# Patient Record
Sex: Male | Born: 1945
Health system: Southern US, Community
[De-identification: ages and names within clinical notes are randomized; demographics above are authoritative.]

---

## 2002-07-12 ENCOUNTER — Emergency Department (HOSPITAL_COMMUNITY): Admission: EM | Admit: 2002-07-12 | Discharge: 2002-07-12 | Payer: Self-pay | Admitting: Emergency Medicine

## 2002-07-12 ENCOUNTER — Encounter: Payer: Self-pay | Admitting: Emergency Medicine

## 2003-05-19 ENCOUNTER — Ambulatory Visit (HOSPITAL_COMMUNITY): Admission: RE | Admit: 2003-05-19 | Discharge: 2003-05-19 | Payer: Self-pay | Admitting: Gastroenterology

## 2013-01-19 ENCOUNTER — Ambulatory Visit
Admission: RE | Admit: 2013-01-19 | Discharge: 2013-01-19 | Disposition: A | Discharge status: Home / Self Care | Payer: Medicare Other | Source: Ambulatory Visit | Attending: Internal Medicine | Admitting: Internal Medicine

## 2013-01-19 ENCOUNTER — Other Ambulatory Visit: Payer: Self-pay | Admitting: Internal Medicine

## 2013-01-19 DIAGNOSIS — R059 Cough, unspecified: Secondary | ICD-10-CM

## 2013-01-19 DIAGNOSIS — R05 Cough: Secondary | ICD-10-CM

## 2015-05-24 DIAGNOSIS — K219 Gastro-esophageal reflux disease without esophagitis: Secondary | ICD-10-CM | POA: Diagnosis not present

## 2015-05-24 DIAGNOSIS — E782 Mixed hyperlipidemia: Secondary | ICD-10-CM | POA: Diagnosis not present

## 2015-05-24 DIAGNOSIS — N400 Enlarged prostate without lower urinary tract symptoms: Secondary | ICD-10-CM | POA: Diagnosis not present

## 2015-05-24 DIAGNOSIS — I10 Essential (primary) hypertension: Secondary | ICD-10-CM | POA: Diagnosis not present

## 2015-05-24 DIAGNOSIS — H3530 Unspecified macular degeneration: Secondary | ICD-10-CM | POA: Diagnosis not present

## 2015-05-24 DIAGNOSIS — Z1389 Encounter for screening for other disorder: Secondary | ICD-10-CM | POA: Diagnosis not present

## 2015-05-24 DIAGNOSIS — Z0000 Encounter for general adult medical examination without abnormal findings: Secondary | ICD-10-CM | POA: Diagnosis not present

## 2015-08-07 DIAGNOSIS — L2084 Intrinsic (allergic) eczema: Secondary | ICD-10-CM | POA: Diagnosis not present

## 2015-08-07 DIAGNOSIS — L814 Other melanin hyperpigmentation: Secondary | ICD-10-CM | POA: Diagnosis not present

## 2015-08-07 DIAGNOSIS — L723 Sebaceous cyst: Secondary | ICD-10-CM | POA: Diagnosis not present

## 2015-08-07 DIAGNOSIS — D1801 Hemangioma of skin and subcutaneous tissue: Secondary | ICD-10-CM | POA: Diagnosis not present

## 2015-08-07 DIAGNOSIS — D225 Melanocytic nevi of trunk: Secondary | ICD-10-CM | POA: Diagnosis not present

## 2015-08-07 DIAGNOSIS — L821 Other seborrheic keratosis: Secondary | ICD-10-CM | POA: Diagnosis not present

## 2015-08-16 DIAGNOSIS — H353111 Nonexudative age-related macular degeneration, right eye, early dry stage: Secondary | ICD-10-CM | POA: Diagnosis not present

## 2015-08-16 DIAGNOSIS — H353222 Exudative age-related macular degeneration, left eye, with inactive choroidal neovascularization: Secondary | ICD-10-CM | POA: Diagnosis not present

## 2015-11-07 DIAGNOSIS — N401 Enlarged prostate with lower urinary tract symptoms: Secondary | ICD-10-CM | POA: Diagnosis not present

## 2015-11-07 DIAGNOSIS — N5201 Erectile dysfunction due to arterial insufficiency: Secondary | ICD-10-CM | POA: Diagnosis not present

## 2015-11-07 DIAGNOSIS — N403 Nodular prostate with lower urinary tract symptoms: Secondary | ICD-10-CM | POA: Diagnosis not present

## 2015-11-07 DIAGNOSIS — R3915 Urgency of urination: Secondary | ICD-10-CM | POA: Diagnosis not present

## 2016-01-21 DIAGNOSIS — H524 Presbyopia: Secondary | ICD-10-CM | POA: Diagnosis not present

## 2016-01-21 DIAGNOSIS — H5203 Hypermetropia, bilateral: Secondary | ICD-10-CM | POA: Diagnosis not present

## 2016-01-21 DIAGNOSIS — H2513 Age-related nuclear cataract, bilateral: Secondary | ICD-10-CM | POA: Diagnosis not present

## 2016-01-21 DIAGNOSIS — H353222 Exudative age-related macular degeneration, left eye, with inactive choroidal neovascularization: Secondary | ICD-10-CM | POA: Diagnosis not present

## 2016-01-21 DIAGNOSIS — H52223 Regular astigmatism, bilateral: Secondary | ICD-10-CM | POA: Diagnosis not present

## 2016-02-26 DIAGNOSIS — H353112 Nonexudative age-related macular degeneration, right eye, intermediate dry stage: Secondary | ICD-10-CM | POA: Diagnosis not present

## 2016-02-26 DIAGNOSIS — H353222 Exudative age-related macular degeneration, left eye, with inactive choroidal neovascularization: Secondary | ICD-10-CM | POA: Diagnosis not present

## 2016-02-26 DIAGNOSIS — D3131 Benign neoplasm of right choroid: Secondary | ICD-10-CM | POA: Diagnosis not present

## 2016-02-26 DIAGNOSIS — H43822 Vitreomacular adhesion, left eye: Secondary | ICD-10-CM | POA: Diagnosis not present

## 2016-06-13 DIAGNOSIS — K219 Gastro-esophageal reflux disease without esophagitis: Secondary | ICD-10-CM | POA: Diagnosis not present

## 2016-06-13 DIAGNOSIS — N400 Enlarged prostate without lower urinary tract symptoms: Secondary | ICD-10-CM | POA: Diagnosis not present

## 2016-06-13 DIAGNOSIS — Z1389 Encounter for screening for other disorder: Secondary | ICD-10-CM | POA: Diagnosis not present

## 2016-06-13 DIAGNOSIS — Z0000 Encounter for general adult medical examination without abnormal findings: Secondary | ICD-10-CM | POA: Diagnosis not present

## 2016-09-02 DIAGNOSIS — H353222 Exudative age-related macular degeneration, left eye, with inactive choroidal neovascularization: Secondary | ICD-10-CM | POA: Diagnosis not present

## 2016-09-26 DIAGNOSIS — K219 Gastro-esophageal reflux disease without esophagitis: Secondary | ICD-10-CM | POA: Diagnosis not present

## 2016-10-03 DIAGNOSIS — H353112 Nonexudative age-related macular degeneration, right eye, intermediate dry stage: Secondary | ICD-10-CM | POA: Diagnosis not present

## 2016-10-03 DIAGNOSIS — D3131 Benign neoplasm of right choroid: Secondary | ICD-10-CM | POA: Diagnosis not present

## 2016-10-03 DIAGNOSIS — H43811 Vitreous degeneration, right eye: Secondary | ICD-10-CM | POA: Diagnosis not present

## 2016-10-03 DIAGNOSIS — H353222 Exudative age-related macular degeneration, left eye, with inactive choroidal neovascularization: Secondary | ICD-10-CM | POA: Diagnosis not present

## 2016-11-05 DIAGNOSIS — N403 Nodular prostate with lower urinary tract symptoms: Secondary | ICD-10-CM | POA: Diagnosis not present

## 2016-11-05 DIAGNOSIS — Z87442 Personal history of urinary calculi: Secondary | ICD-10-CM | POA: Diagnosis not present

## 2016-11-05 DIAGNOSIS — R3915 Urgency of urination: Secondary | ICD-10-CM | POA: Diagnosis not present

## 2016-11-05 DIAGNOSIS — N401 Enlarged prostate with lower urinary tract symptoms: Secondary | ICD-10-CM | POA: Diagnosis not present

## 2016-12-02 DIAGNOSIS — H353112 Nonexudative age-related macular degeneration, right eye, intermediate dry stage: Secondary | ICD-10-CM | POA: Diagnosis not present

## 2016-12-02 DIAGNOSIS — H353222 Exudative age-related macular degeneration, left eye, with inactive choroidal neovascularization: Secondary | ICD-10-CM | POA: Diagnosis not present

## 2016-12-17 DIAGNOSIS — L821 Other seborrheic keratosis: Secondary | ICD-10-CM | POA: Diagnosis not present

## 2016-12-17 DIAGNOSIS — D485 Neoplasm of uncertain behavior of skin: Secondary | ICD-10-CM | POA: Diagnosis not present

## 2016-12-17 DIAGNOSIS — L719 Rosacea, unspecified: Secondary | ICD-10-CM | POA: Diagnosis not present

## 2016-12-17 DIAGNOSIS — C44612 Basal cell carcinoma of skin of right upper limb, including shoulder: Secondary | ICD-10-CM | POA: Diagnosis not present

## 2016-12-17 DIAGNOSIS — D225 Melanocytic nevi of trunk: Secondary | ICD-10-CM | POA: Diagnosis not present

## 2017-01-08 DIAGNOSIS — Z23 Encounter for immunization: Secondary | ICD-10-CM | POA: Diagnosis not present

## 2017-01-08 DIAGNOSIS — C44612 Basal cell carcinoma of skin of right upper limb, including shoulder: Secondary | ICD-10-CM | POA: Diagnosis not present

## 2017-01-23 DIAGNOSIS — H353112 Nonexudative age-related macular degeneration, right eye, intermediate dry stage: Secondary | ICD-10-CM | POA: Diagnosis not present

## 2017-01-23 DIAGNOSIS — H43811 Vitreous degeneration, right eye: Secondary | ICD-10-CM | POA: Diagnosis not present

## 2017-01-23 DIAGNOSIS — H43822 Vitreomacular adhesion, left eye: Secondary | ICD-10-CM | POA: Diagnosis not present

## 2017-01-23 DIAGNOSIS — H353222 Exudative age-related macular degeneration, left eye, with inactive choroidal neovascularization: Secondary | ICD-10-CM | POA: Diagnosis not present

## 2017-03-16 DIAGNOSIS — H2513 Age-related nuclear cataract, bilateral: Secondary | ICD-10-CM | POA: Diagnosis not present

## 2017-06-22 DIAGNOSIS — Z0000 Encounter for general adult medical examination without abnormal findings: Secondary | ICD-10-CM | POA: Diagnosis not present

## 2017-06-22 DIAGNOSIS — Z1389 Encounter for screening for other disorder: Secondary | ICD-10-CM | POA: Diagnosis not present

## 2017-06-22 DIAGNOSIS — E7800 Pure hypercholesterolemia, unspecified: Secondary | ICD-10-CM | POA: Diagnosis not present

## 2017-06-22 DIAGNOSIS — K219 Gastro-esophageal reflux disease without esophagitis: Secondary | ICD-10-CM | POA: Diagnosis not present

## 2017-07-17 DIAGNOSIS — H43822 Vitreomacular adhesion, left eye: Secondary | ICD-10-CM | POA: Diagnosis not present

## 2017-07-17 DIAGNOSIS — H353222 Exudative age-related macular degeneration, left eye, with inactive choroidal neovascularization: Secondary | ICD-10-CM | POA: Diagnosis not present

## 2017-07-17 DIAGNOSIS — H353112 Nonexudative age-related macular degeneration, right eye, intermediate dry stage: Secondary | ICD-10-CM | POA: Diagnosis not present

## 2017-07-17 DIAGNOSIS — D3131 Benign neoplasm of right choroid: Secondary | ICD-10-CM | POA: Diagnosis not present

## 2017-08-25 DIAGNOSIS — M9903 Segmental and somatic dysfunction of lumbar region: Secondary | ICD-10-CM | POA: Diagnosis not present

## 2017-08-25 DIAGNOSIS — M545 Low back pain: Secondary | ICD-10-CM | POA: Diagnosis not present

## 2017-08-25 DIAGNOSIS — M9905 Segmental and somatic dysfunction of pelvic region: Secondary | ICD-10-CM | POA: Diagnosis not present

## 2017-08-25 DIAGNOSIS — M9904 Segmental and somatic dysfunction of sacral region: Secondary | ICD-10-CM | POA: Diagnosis not present

## 2017-08-26 DIAGNOSIS — M9903 Segmental and somatic dysfunction of lumbar region: Secondary | ICD-10-CM | POA: Diagnosis not present

## 2017-08-26 DIAGNOSIS — M9904 Segmental and somatic dysfunction of sacral region: Secondary | ICD-10-CM | POA: Diagnosis not present

## 2017-08-26 DIAGNOSIS — M545 Low back pain: Secondary | ICD-10-CM | POA: Diagnosis not present

## 2017-08-26 DIAGNOSIS — M9905 Segmental and somatic dysfunction of pelvic region: Secondary | ICD-10-CM | POA: Diagnosis not present

## 2017-08-28 DIAGNOSIS — M9905 Segmental and somatic dysfunction of pelvic region: Secondary | ICD-10-CM | POA: Diagnosis not present

## 2017-08-28 DIAGNOSIS — M545 Low back pain: Secondary | ICD-10-CM | POA: Diagnosis not present

## 2017-08-28 DIAGNOSIS — M9903 Segmental and somatic dysfunction of lumbar region: Secondary | ICD-10-CM | POA: Diagnosis not present

## 2017-08-28 DIAGNOSIS — M9904 Segmental and somatic dysfunction of sacral region: Secondary | ICD-10-CM | POA: Diagnosis not present

## 2017-09-11 DIAGNOSIS — N200 Calculus of kidney: Secondary | ICD-10-CM | POA: Diagnosis not present

## 2017-11-06 DIAGNOSIS — S61411D Laceration without foreign body of right hand, subsequent encounter: Secondary | ICD-10-CM | POA: Diagnosis not present

## 2017-11-10 DIAGNOSIS — D485 Neoplasm of uncertain behavior of skin: Secondary | ICD-10-CM | POA: Diagnosis not present

## 2017-11-10 DIAGNOSIS — L821 Other seborrheic keratosis: Secondary | ICD-10-CM | POA: Diagnosis not present

## 2017-11-10 DIAGNOSIS — D1801 Hemangioma of skin and subcutaneous tissue: Secondary | ICD-10-CM | POA: Diagnosis not present

## 2017-11-10 DIAGNOSIS — L570 Actinic keratosis: Secondary | ICD-10-CM | POA: Diagnosis not present

## 2017-11-10 DIAGNOSIS — L814 Other melanin hyperpigmentation: Secondary | ICD-10-CM | POA: Diagnosis not present

## 2017-12-16 DIAGNOSIS — N200 Calculus of kidney: Secondary | ICD-10-CM | POA: Diagnosis not present

## 2017-12-16 DIAGNOSIS — N5201 Erectile dysfunction due to arterial insufficiency: Secondary | ICD-10-CM | POA: Diagnosis not present

## 2017-12-16 DIAGNOSIS — N401 Enlarged prostate with lower urinary tract symptoms: Secondary | ICD-10-CM | POA: Diagnosis not present

## 2017-12-16 DIAGNOSIS — R3915 Urgency of urination: Secondary | ICD-10-CM | POA: Diagnosis not present

## 2018-02-12 DIAGNOSIS — H353112 Nonexudative age-related macular degeneration, right eye, intermediate dry stage: Secondary | ICD-10-CM | POA: Diagnosis not present

## 2018-02-12 DIAGNOSIS — H43811 Vitreous degeneration, right eye: Secondary | ICD-10-CM | POA: Diagnosis not present

## 2018-02-12 DIAGNOSIS — H353222 Exudative age-related macular degeneration, left eye, with inactive choroidal neovascularization: Secondary | ICD-10-CM | POA: Diagnosis not present

## 2018-04-01 DIAGNOSIS — J0120 Acute ethmoidal sinusitis, unspecified: Secondary | ICD-10-CM | POA: Diagnosis not present

## 2018-04-05 ENCOUNTER — Emergency Department (HOSPITAL_COMMUNITY): Payer: Medicare Other

## 2018-04-05 ENCOUNTER — Encounter (HOSPITAL_COMMUNITY): Payer: Self-pay | Admitting: *Deleted

## 2018-04-05 ENCOUNTER — Inpatient Hospital Stay (HOSPITAL_COMMUNITY)
Admission: EM | Admit: 2018-04-05 | Discharge: 2018-04-06 | DRG: 282 | Disposition: A | Discharge status: Home / Self Care | Payer: Medicare Other | Attending: Internal Medicine | Admitting: Internal Medicine

## 2018-04-05 ENCOUNTER — Other Ambulatory Visit: Payer: Self-pay

## 2018-04-05 DIAGNOSIS — R0600 Dyspnea, unspecified: Secondary | ICD-10-CM | POA: Diagnosis not present

## 2018-04-05 DIAGNOSIS — R1013 Epigastric pain: Secondary | ICD-10-CM | POA: Diagnosis not present

## 2018-04-05 DIAGNOSIS — Z79899 Other long term (current) drug therapy: Secondary | ICD-10-CM

## 2018-04-05 DIAGNOSIS — R7989 Other specified abnormal findings of blood chemistry: Secondary | ICD-10-CM | POA: Diagnosis not present

## 2018-04-05 DIAGNOSIS — N400 Enlarged prostate without lower urinary tract symptoms: Secondary | ICD-10-CM | POA: Diagnosis not present

## 2018-04-05 DIAGNOSIS — E785 Hyperlipidemia, unspecified: Secondary | ICD-10-CM | POA: Diagnosis not present

## 2018-04-05 DIAGNOSIS — I2510 Atherosclerotic heart disease of native coronary artery without angina pectoris: Secondary | ICD-10-CM

## 2018-04-05 DIAGNOSIS — I4510 Unspecified right bundle-branch block: Secondary | ICD-10-CM | POA: Diagnosis not present

## 2018-04-05 DIAGNOSIS — R079 Chest pain, unspecified: Secondary | ICD-10-CM | POA: Diagnosis not present

## 2018-04-05 DIAGNOSIS — G4700 Insomnia, unspecified: Secondary | ICD-10-CM | POA: Diagnosis not present

## 2018-04-05 DIAGNOSIS — M79602 Pain in left arm: Secondary | ICD-10-CM | POA: Diagnosis not present

## 2018-04-05 DIAGNOSIS — I214 Non-ST elevation (NSTEMI) myocardial infarction: Principal | ICD-10-CM | POA: Diagnosis present

## 2018-04-05 DIAGNOSIS — R51 Headache: Secondary | ICD-10-CM | POA: Diagnosis not present

## 2018-04-05 DIAGNOSIS — I509 Heart failure, unspecified: Secondary | ICD-10-CM | POA: Diagnosis not present

## 2018-04-05 DIAGNOSIS — I340 Nonrheumatic mitral (valve) insufficiency: Secondary | ICD-10-CM | POA: Diagnosis not present

## 2018-04-05 DIAGNOSIS — R778 Other specified abnormalities of plasma proteins: Secondary | ICD-10-CM

## 2018-04-05 DIAGNOSIS — R0602 Shortness of breath: Secondary | ICD-10-CM | POA: Diagnosis not present

## 2018-04-05 LAB — CBC
HCT: 40.3 % (ref 39.0–52.0)
Hemoglobin: 13.1 g/dL (ref 13.0–17.0)
MCH: 30.5 pg (ref 26.0–34.0)
MCHC: 32.5 g/dL (ref 30.0–36.0)
MCV: 93.7 fL (ref 80.0–100.0)
Platelets: 366 10*3/uL (ref 150–400)
RBC: 4.3 MIL/uL (ref 4.22–5.81)
RDW: 13.2 % (ref 11.5–15.5)
WBC: 9.7 10*3/uL (ref 4.0–10.5)
nRBC: 0 % (ref 0.0–0.2)

## 2018-04-05 LAB — BASIC METABOLIC PANEL
Anion gap: 10 (ref 5–15)
BUN: 11 mg/dL (ref 8–23)
CO2: 25 mmol/L (ref 22–32)
CREATININE: 0.93 mg/dL (ref 0.61–1.24)
Calcium: 9.1 mg/dL (ref 8.9–10.3)
Chloride: 105 mmol/L (ref 98–111)
GFR calc Af Amer: >60 mL/min (ref >60)
GFR calc non Af Amer: >60 mL/min (ref >60)
Glucose, Bld: 128 mg/dL — ABNORMAL HIGH (ref 70–99)
Potassium: 3.5 mmol/L (ref 3.5–5.1)
Sodium: 140 mmol/L (ref 135–145)

## 2018-04-05 LAB — TROPONIN I: Troponin I: 0.21 ng/mL (ref <0.03)

## 2018-04-05 NOTE — ED Triage Notes (Signed)
The pt has been ill for almost 2 weeks headache chest tifghtness today muscle aches  He saw his doctor earlier today and had labwork drawn  He was called approc 2 hoours ago and was told he had abmnormal labs to come to the ed

## 2018-04-06 ENCOUNTER — Emergency Department (HOSPITAL_COMMUNITY): Payer: Medicare Other

## 2018-04-06 ENCOUNTER — Inpatient Hospital Stay (HOSPITAL_COMMUNITY): Payer: Medicare Other

## 2018-04-06 ENCOUNTER — Encounter (HOSPITAL_COMMUNITY)
Admission: EM | Disposition: A | Discharge status: Home / Self Care | Payer: Self-pay | Source: Home / Self Care | Attending: Internal Medicine

## 2018-04-06 ENCOUNTER — Other Ambulatory Visit: Payer: Self-pay

## 2018-04-06 ENCOUNTER — Encounter (HOSPITAL_COMMUNITY): Payer: Self-pay | Admitting: *Deleted

## 2018-04-06 DIAGNOSIS — I340 Nonrheumatic mitral (valve) insufficiency: Secondary | ICD-10-CM | POA: Diagnosis not present

## 2018-04-06 DIAGNOSIS — N400 Enlarged prostate without lower urinary tract symptoms: Secondary | ICD-10-CM | POA: Diagnosis present

## 2018-04-06 DIAGNOSIS — I2510 Atherosclerotic heart disease of native coronary artery without angina pectoris: Secondary | ICD-10-CM | POA: Diagnosis present

## 2018-04-06 DIAGNOSIS — R7989 Other specified abnormal findings of blood chemistry: Secondary | ICD-10-CM | POA: Diagnosis not present

## 2018-04-06 DIAGNOSIS — R51 Headache: Secondary | ICD-10-CM | POA: Diagnosis present

## 2018-04-06 DIAGNOSIS — E785 Hyperlipidemia, unspecified: Secondary | ICD-10-CM | POA: Diagnosis present

## 2018-04-06 DIAGNOSIS — I4510 Unspecified right bundle-branch block: Secondary | ICD-10-CM | POA: Diagnosis present

## 2018-04-06 DIAGNOSIS — I214 Non-ST elevation (NSTEMI) myocardial infarction: Secondary | ICD-10-CM

## 2018-04-06 DIAGNOSIS — Z79899 Other long term (current) drug therapy: Secondary | ICD-10-CM | POA: Diagnosis not present

## 2018-04-06 DIAGNOSIS — R778 Other specified abnormalities of plasma proteins: Secondary | ICD-10-CM

## 2018-04-06 DIAGNOSIS — I25119 Atherosclerotic heart disease of native coronary artery with unspecified angina pectoris: Secondary | ICD-10-CM

## 2018-04-06 HISTORY — PX: LEFT HEART CATH AND CORONARY ANGIOGRAPHY: CATH118249

## 2018-04-06 LAB — BASIC METABOLIC PANEL
Anion gap: 12 (ref 5–15)
BUN: 10 mg/dL (ref 8–23)
CO2: 24 mmol/L (ref 22–32)
Calcium: 8.6 mg/dL — ABNORMAL LOW (ref 8.9–10.3)
Chloride: 105 mmol/L (ref 98–111)
Creatinine, Ser: 0.92 mg/dL (ref 0.61–1.24)
GFR calc Af Amer: >60 mL/min (ref >60)
Glucose, Bld: 116 mg/dL — ABNORMAL HIGH (ref 70–99)
Potassium: 3.7 mmol/L (ref 3.5–5.1)
Sodium: 141 mmol/L (ref 135–145)

## 2018-04-06 LAB — CBC
HCT: 37.7 % — ABNORMAL LOW (ref 39.0–52.0)
Hemoglobin: 12.8 g/dL — ABNORMAL LOW (ref 13.0–17.0)
MCH: 31.4 pg (ref 26.0–34.0)
MCHC: 34 g/dL (ref 30.0–36.0)
MCV: 92.6 fL (ref 80.0–100.0)
Platelets: 377 10*3/uL (ref 150–400)
RBC: 4.07 MIL/uL — AB (ref 4.22–5.81)
RDW: 13.2 % (ref 11.5–15.5)
WBC: 9.8 10*3/uL (ref 4.0–10.5)
nRBC: 0 % (ref 0.0–0.2)

## 2018-04-06 LAB — LIPID PANEL
Cholesterol: 119 mg/dL (ref 0–200)
HDL: 23 mg/dL — ABNORMAL LOW (ref >40)
LDL Cholesterol: 85 mg/dL (ref 0–99)
Total CHOL/HDL Ratio: 5.2 RATIO
Triglycerides: 53 mg/dL (ref <150)
VLDL: 11 mg/dL (ref 0–40)

## 2018-04-06 LAB — CK: Total CK: 82 U/L (ref 49–397)

## 2018-04-06 LAB — TSH: TSH: 3.248 u[IU]/mL (ref 0.350–4.500)

## 2018-04-06 LAB — ECHOCARDIOGRAM COMPLETE
HEIGHTINCHES: 72 in
Weight: 3120 oz

## 2018-04-06 LAB — T4, FREE: Free T4: 1.1 ng/dL (ref 0.82–1.77)

## 2018-04-06 LAB — BRAIN NATRIURETIC PEPTIDE: B Natriuretic Peptide: 624.3 pg/mL — ABNORMAL HIGH (ref 0.0–100.0)

## 2018-04-06 LAB — PROTIME-INR
INR: 1.19
Prothrombin Time: 14.9 seconds (ref 11.4–15.2)

## 2018-04-06 LAB — D-DIMER, QUANTITATIVE: D-Dimer, Quant: 1.44 ug/mL-FEU — ABNORMAL HIGH (ref 0.00–0.50)

## 2018-04-06 LAB — TROPONIN I
Troponin I: 0.28 ng/mL (ref <0.03)
Troponin I: 0.23 ng/mL (ref <0.03)

## 2018-04-06 LAB — MRSA PCR SCREENING: MRSA by PCR: NEGATIVE

## 2018-04-06 LAB — HIV ANTIBODY (ROUTINE TESTING W REFLEX): HIV Screen 4th Generation wRfx: NONREACTIVE

## 2018-04-06 SURGERY — LEFT HEART CATH AND CORONARY ANGIOGRAPHY
Anesthesia: LOCAL

## 2018-04-06 MED ORDER — ASPIRIN EC 81 MG PO TBEC: 81.0000 mg | DELAYED_RELEASE_TABLET | Freq: Every day | ORAL | Status: DC

## 2018-04-06 MED ORDER — LIDOCAINE HCL (PF) 1 % IJ SOLN
INTRAMUSCULAR | Status: AC
  Filled 2018-04-06: qty 30

## 2018-04-06 MED ORDER — ASPIRIN 81 MG PO TBEC: 81.0000 mg | DELAYED_RELEASE_TABLET | Freq: Every day | ORAL | 0 refills | Status: AC

## 2018-04-06 MED ORDER — HEPARIN (PORCINE) 25000 UT/250ML-% IV SOLN
1100.0000 [IU]/h | INTRAVENOUS | Status: DC
  Administered 2018-04-06: 1100 [IU]/h via INTRAVENOUS
  Filled 2018-04-06: qty 250

## 2018-04-06 MED ORDER — ACETAMINOPHEN 325 MG PO TABS
650.0000 mg | ORAL_TABLET | ORAL | Status: DC | PRN
  Administered 2018-04-06: 650 mg via ORAL
  Filled 2018-04-06: qty 2

## 2018-04-06 MED ORDER — PANTOPRAZOLE SODIUM 20 MG PO TBEC
20.0000 mg | DELAYED_RELEASE_TABLET | Freq: Two times a day (BID) | ORAL | Status: DC
  Administered 2018-04-06: 20 mg via ORAL
  Filled 2018-04-06 (×2): qty 1

## 2018-04-06 MED ORDER — SODIUM CHLORIDE 0.9% FLUSH: 3.0000 mL | INTRAVENOUS | Status: DC | PRN

## 2018-04-06 MED ORDER — ATORVASTATIN CALCIUM 80 MG PO TABS
80.0000 mg | ORAL_TABLET | Freq: Every day | ORAL | Status: DC
  Filled 2018-04-06: qty 1

## 2018-04-06 MED ORDER — SODIUM CHLORIDE 0.9 % IV SOLN: 250.0000 mL | INTRAVENOUS | Status: DC | PRN

## 2018-04-06 MED ORDER — ASPIRIN 81 MG PO CHEW: 81.0000 mg | CHEWABLE_TABLET | ORAL | Status: DC

## 2018-04-06 MED ORDER — SODIUM CHLORIDE 0.9 % WEIGHT BASED INFUSION: 1.0000 mL/kg/h | INTRAVENOUS | Status: DC

## 2018-04-06 MED ORDER — HEPARIN BOLUS VIA INFUSION
4000.0000 [IU] | Freq: Once | INTRAVENOUS | Status: AC
  Administered 2018-04-06: 4000 [IU] via INTRAVENOUS
  Filled 2018-04-06: qty 4000

## 2018-04-06 MED ORDER — HEPARIN SODIUM (PORCINE) 1000 UNIT/ML IJ SOLN
INTRAMUSCULAR | Status: DC | PRN
  Administered 2018-04-06: 4500 [IU] via INTRAVENOUS

## 2018-04-06 MED ORDER — SODIUM CHLORIDE 0.9 % WEIGHT BASED INFUSION
3.0000 mL/kg/h | INTRAVENOUS | Status: DC
  Administered 2018-04-06: 3 mL/kg/h via INTRAVENOUS

## 2018-04-06 MED ORDER — ATORVASTATIN CALCIUM 20 MG PO TABS: 10.0000 mg | ORAL_TABLET | Freq: Every day | ORAL | 3 refills | Status: AC

## 2018-04-06 MED ORDER — IOPAMIDOL (ISOVUE-370) INJECTION 76%
75.0000 mL | Freq: Once | INTRAVENOUS | Status: AC | PRN
  Administered 2018-04-06: 75 mL via INTRAVENOUS

## 2018-04-06 MED ORDER — IBUPROFEN 200 MG PO TABS
800.0000 mg | ORAL_TABLET | Freq: Once | ORAL | Status: AC
  Administered 2018-04-06: 800 mg via ORAL
  Filled 2018-04-06: qty 4

## 2018-04-06 MED ORDER — NITROGLYCERIN 2 % TD OINT
1.0000 [in_us] | TOPICAL_OINTMENT | Freq: Once | TRANSDERMAL | Status: AC
  Administered 2018-04-06: 1 [in_us] via TOPICAL
  Filled 2018-04-06: qty 1

## 2018-04-06 MED ORDER — HEPARIN (PORCINE) IN NACL 1000-0.9 UT/500ML-% IV SOLN
INTRAVENOUS | Status: AC
  Filled 2018-04-06: qty 1000

## 2018-04-06 MED ORDER — SODIUM CHLORIDE 0.9% FLUSH: 3.0000 mL | Freq: Two times a day (BID) | INTRAVENOUS | Status: DC

## 2018-04-06 MED ORDER — SODIUM CHLORIDE 0.9 % IV SOLN: INTRAVENOUS | Status: DC

## 2018-04-06 MED ORDER — IOHEXOL 350 MG/ML SOLN
INTRAVENOUS | Status: DC | PRN
  Administered 2018-04-06: 45 mL via INTRAVENOUS

## 2018-04-06 MED ORDER — ASPIRIN 81 MG PO CHEW
324.0000 mg | CHEWABLE_TABLET | Freq: Once | ORAL | Status: AC
  Administered 2018-04-06: 324 mg via ORAL
  Filled 2018-04-06: qty 4

## 2018-04-06 MED ORDER — NITROGLYCERIN 0.4 MG SL SUBL: 0.4000 mg | SUBLINGUAL_TABLET | SUBLINGUAL | Status: DC | PRN

## 2018-04-06 MED ORDER — HEPARIN (PORCINE) IN NACL 1000-0.9 UT/500ML-% IV SOLN
INTRAVENOUS | Status: DC | PRN
  Administered 2018-04-06 (×2): 500 mL

## 2018-04-06 MED ORDER — MIDAZOLAM HCL 2 MG/2ML IJ SOLN
INTRAMUSCULAR | Status: AC
  Filled 2018-04-06: qty 2

## 2018-04-06 MED ORDER — VERAPAMIL HCL 2.5 MG/ML IV SOLN
INTRAVENOUS | Status: DC | PRN
  Administered 2018-04-06: 10 mL via INTRA_ARTERIAL

## 2018-04-06 MED ORDER — IBUPROFEN 200 MG PO TABS: 400.0000 mg | ORAL_TABLET | Freq: Three times a day (TID) | ORAL | Status: AC

## 2018-04-06 MED ORDER — LIDOCAINE HCL (PF) 1 % IJ SOLN
INTRAMUSCULAR | Status: DC | PRN
  Administered 2018-04-06: 2 mL

## 2018-04-06 MED ORDER — MIDAZOLAM HCL 2 MG/2ML IJ SOLN
INTRAMUSCULAR | Status: DC | PRN
  Administered 2018-04-06: 2 mg via INTRAVENOUS

## 2018-04-06 MED ORDER — ONDANSETRON HCL 4 MG/2ML IJ SOLN: 4.0000 mg | Freq: Four times a day (QID) | INTRAMUSCULAR | Status: DC | PRN

## 2018-04-06 MED ORDER — FENTANYL CITRATE (PF) 100 MCG/2ML IJ SOLN
INTRAMUSCULAR | Status: DC | PRN
  Administered 2018-04-06: 25 ug via INTRAVENOUS

## 2018-04-06 MED ORDER — METOPROLOL TARTRATE 12.5 MG HALF TABLET: 12.5000 mg | ORAL_TABLET | Freq: Two times a day (BID) | ORAL | Status: DC

## 2018-04-06 MED ORDER — SODIUM CHLORIDE 0.9 % WEIGHT BASED INFUSION: 3.0000 mL/kg/h | INTRAVENOUS | Status: DC

## 2018-04-06 MED ORDER — VERAPAMIL HCL 2.5 MG/ML IV SOLN
INTRAVENOUS | Status: AC
  Filled 2018-04-06: qty 2

## 2018-04-06 MED ORDER — FENTANYL CITRATE (PF) 100 MCG/2ML IJ SOLN
INTRAMUSCULAR | Status: AC
  Filled 2018-04-06: qty 2

## 2018-04-06 SURGICAL SUPPLY — 10 items
CATH 5FR JL3.5 JR4 ANG PIG MP (CATHETERS) ×2 IMPLANT
DEVICE RAD COMP TR BAND LRG (VASCULAR PRODUCTS) ×2 IMPLANT
GLIDESHEATH SLEND SS 6F .021 (SHEATH) ×2 IMPLANT
GUIDEWIRE INQWIRE 1.5J.035X260 (WIRE) ×1 IMPLANT
INQWIRE 1.5J .035X260CM (WIRE) ×2
KIT HEART LEFT (KITS) ×2 IMPLANT
PACK CARDIAC CATHETERIZATION (CUSTOM PROCEDURE TRAY) ×2 IMPLANT
SYR MEDRAD MARK 7 150ML (SYRINGE) ×2 IMPLANT
TRANSDUCER W/STOPCOCK (MISCELLANEOUS) ×2 IMPLANT
TUBING CIL FLEX 10 FLL-RA (TUBING) ×2 IMPLANT

## 2018-04-06 NOTE — Progress Notes (Addendum)
 Progress Note  Patient Name: Jaime Clements Date of Encounter: 04/06/2018  Primary Cardiologist: New to [facility]; Dr. Nahser  Subjective   Patient reports no trouble with breathing since arriving to the ER. Chest pain was very mild on arrival and gone at this time. Only complaint is a headache for which he request ibuprofen.   He complains of not being able to sleep while here in the hospital.  He is not having any chest pain.  His main complaint is 10 days of shortness of breath.  This started as a sinus infection.  He   denies any fever.  He was found to have an elevated d-dimer in the emergency room. CT angiogram of the chest reveals no evidence of pulmonary emboli.  The cardiac size was normal.  Inpatient Medications    Scheduled Meds: . [START ON 04/07/2018] aspirin EC  81 mg Oral Daily  . atorvastatin  80 mg Oral q1800  . metoprolol tartrate  12.5 mg Oral BID  . pantoprazole  20 mg Oral BID   Continuous Infusions: . heparin 1,100 Units/hr (04/06/18 0638)   PRN Meds: acetaminophen, nitroGLYCERIN, ondansetron (ZOFRAN) IV   Vital Signs    Vitals:   04/06/18 0345 04/06/18 0400 04/06/18 0430 04/06/18 0730  BP: (!) 151/82 138/86 119/75 111/72  Pulse: (!) 104 99 86 86  Resp: 13 15 13 12  Temp:    97.9 F (36.6 C)  TempSrc:    Oral  SpO2: 95% 95% 93% 95%  Weight:      Height:        Intake/Output Summary (Last 24 hours) at 04/06/2018 0753 Last data filed at 04/06/2018 0447 Gross per 24 hour  Intake -  Output 800 ml  Net -800 ml   Filed Weights   04/05/18 2226  Weight: 88.5 kg    Telemetry    Sinus rhythm/ sinus bradycardia - Personally Reviewed  ECG    Sinus rhythm with PACs, incomplete RBBB - Personally Reviewed  Physical Exam   GEN: Laying in bed in no acute distress.   Neck: No JVD, no carotid bruits Cardiac: RRR, no murmurs, rubs, or gallops.  Respiratory: Clear to auscultation bilaterally, no wheezes/ rales/ rhonchi GI: NABS, Soft,  nontender, non-distended  MS: No edema; No deformity. Neuro:  Nonfocal, moving all extremities spontaneously Psych: Normal affect   Labs    Chemistry Recent Labs  Lab 04/05/18 2242  NA 140  K 3.5  CL 105  CO2 25  GLUCOSE 128*  BUN 11  CREATININE 0.93  CALCIUM 9.1  GFRNONAA >60  GFRAA >60  ANIONGAP 10     Hematology Recent Labs  Lab 04/05/18 2242 04/06/18 0647  WBC 9.7 9.8  RBC 4.30 4.07*  HGB 13.1 12.8*  HCT 40.3 37.7*  MCV 93.7 92.6  MCH 30.5 31.4  MCHC 32.5 34.0  RDW 13.2 13.2  PLT 366 377    Cardiac Enzymes Recent Labs  Lab 04/05/18 2242  TROPONINI 0.21*   No results for input(s): TROPIPOC in the last 168 hours.   BNP Recent Labs  Lab 04/05/18 2258  BNP 624.3*     DDimer  Recent Labs  Lab 04/05/18 2258  DDIMER 1.44*     Radiology    Dg Chest 2 View  Result Date: 04/05/2018 CLINICAL DATA:  Chest pain EXAM: CHEST - 2 VIEW COMPARISON:  01/19/2013 FINDINGS: The lungs are well inflated. The cardiomediastinal contours are normal. There is pulmonary vascular congestion without overt edema. There   is no pleural effusion or pneumothorax. IMPRESSION: Pulmonary vascular congestion without overt pulmonary edema. Electronically Signed   By: Kevin  Herman M.D.   On: 04/05/2018 23:10   Ct Angio Chest Pe W And/or Wo Contrast  Result Date: 04/06/2018 CLINICAL DATA:  Chest pain and shortness of breath for several days EXAM: CT ANGIOGRAPHY CHEST WITH CONTRAST TECHNIQUE: Multidetector CT imaging of the chest was performed using the standard protocol during bolus administration of intravenous contrast. Multiplanar CT image reconstructions and MIPs were obtained to evaluate the vascular anatomy. CONTRAST:  75mL ISOVUE-370 IOPAMIDOL (ISOVUE-370) INJECTION 76% COMPARISON:  Plain film from earlier in the same day. FINDINGS: Cardiovascular: Thoracic aorta is within normal limits. No aneurysmal dilatation or dissection is seen. No cardiac enlargement is noted. No  coronary calcifications are seen. The pulmonary artery demonstrates a normal branching pattern. No filling defects to suggest pulmonary emboli are identified. Mild central vascular congestion is noted similar to that seen on prior plain film. Mediastinum/Nodes: The thoracic inlet is within normal limits. No significant mediastinal adenopathy is noted. Bilateral hilar lymph nodes are seen likely of a reactive nature. A few calcified mediastinal nodes are seen consistent with prior granulomatous disease. Lungs/Pleura: The lungs are well aerated bilaterally. No focal confluent infiltrate is seen. Scattered calcified and noncalcified nodules are noted measuring less than 5 mm consistent with prior granulomatous disease. Upper Abdomen: Visualized upper abdomen is within normal limits. Musculoskeletal: No acute bony abnormality is noted. Review of the MIP images confirms the above findings. IMPRESSION: No evidence of pulmonary emboli. Mild vascular congestion is noted. Changes consistent with prior granulomatous disease Electronically Signed   By: Mark  Lukens M.D.   On: 04/06/2018 01:44    Cardiac Studies   Echo pending  Patient Profile     72 y.o. male with PMH of HLD, BPH, and GERD, who presented to the hospital with SOB and chest pain.   Assessment & Plan    1. Chest pain and SOB: patient presented with progressive SOB and chest pain. No prior cardiac disease. Recent viral illness. CXR and CT chest with mild vascular congestion; no PE and no coronary calcifications noted. BNP was elevated to 624. Trop 0.21>0.28. CK wnl. Lipids with LDL 85, total cholesterol 119. EKG with sinus rhythm with incomplete RBBB and PACs; QTC 486. He was given ASA and SL nitro in the ED. Started on a heparin gtt, high intensity statin, and metoprolol 12.5mg BID. Differential includes ACS, acute CHF, myocarditis given recent illness. He appears euvolemic on exam with clear lungs.  - Echo pending to assess LV function and wall  motion abnormalities.  - Continue to trend trop to peak - Can continue heparin gtt for now - Will hold metoprolol at this time pending echo results and overnight baseline bradycardia.  - No indication for lasix given clear lungs on exam and no complaints of SOB at this time - low threshold for 1x dose if SOB returns.  - Further ischemic evaluation and medication adjustments pending echo.   2. HLD: LDL 85 on lipid panel this morning. His atorvastatin was increased to 80mg daily from 10mg daily given concerns for ACS.  - Continue high intensity statin for now  3. Headache: likely attributed to SL nitro and lack of sleep.  - Will give one time dose of ibuprofen as this usually does the trick at home.     For questions or updates, please contact [facility] Please consult [email or web address] for contact info under Cardiology/STEMI.        Signed, Krista M. Kroeger, PA-C  04/06/2018, 7:53 AM   [phone]  Attending Note:   The patient was seen and examined.  Agree with assessment and plan as noted above.  Changes made to the above note as needed.  Patient seen and independently examined with Krista Kroeger, PA .   We discussed all aspects of the encounter. I agree with the assessment and plan as stated above.  1.  Shortness of breath: We are awaiting an echocardiogram this morning.  His cardiac exam is normal.  He does not have any JVD, no leg edema and his lungs are clear.  It is difficult to imagine that he has significant congestive heart failure.  At this point we do not have an explanation for his mildly elevated troponin levels.  I would have a low threshold to proceed with heart catheterization Discussed the risks, benefits, options of heart catheterization.  He understands and agrees to proceed if we make that recommendation.   2.  Elevated troponin level.  He has mild ST segment elevation in leads aVR.  No other acute changes.  We will continue to follow.`    I have spent a  total of 40 minutes with patient reviewing hospital  notes , telemetry, EKGs, labs and examining patient as well as establishing an assessment and plan that was discussed with the patient. > 50% of time was spent in direct patient care.    Philip J. Nahser, Jr., MD, FACC 04/06/2018, 9:42 AM [address],  [address] Office - [phone] Pager [phone]    

## 2018-04-06 NOTE — ED Provider Notes (Signed)
[facility] Provider Note   CSN: [id number] Arrival date & time: 04/05/18  2124     History   Chief Complaint Chief Complaint  Patient presents with  . Chest Pain    HPI Cline CoolsClaude P Point is a 72 y.o. male.  The history is provided by the patient and the spouse.  Chest Pain   This is a new problem. The current episode started more than 2 days ago. The problem occurs daily. The problem has been resolved. The pain is associated with exertion and eating. The pain is present in the substernal region. The pain is moderate. Quality: Tightness. The pain does not radiate. Associated symptoms include shortness of breath. Pertinent negatives include no diaphoresis and no lower extremity edema. Associated symptoms comments: Diarrhea. He has tried nothing for the symptoms.   Patient presents with chest pain.  He reports over 2 weeks ago he was found to have sinusitis with significant symptoms, and had been started on Augmentin.  Over the past several days he has been having episodes of chest tightness and shortness of breath, one time it was with eating, has also been with exertion.  He reports extreme fatigue and muscle aches at times.  He also reports associated left arm pain.  He is currently chest pain-free No known history of CAD/PE/CVA. Seen by PCP today who ordered outpatient labs including a d-dimer, troponin, he was called and told that both are elevated and told to go the ER  PMH- hyperlipidemia fam hx - negative for CAD Home Medications    Prior to Admission medications   Not on File    Family History No family history on file.  Social History Social History   Tobacco Use  . Smoking status: Never Smoker  . Smokeless tobacco: Never Used  Substance Use Topics  . Alcohol use: Yes  . Drug use: Not on file     Allergies   Codeine   Review of Systems Review of Systems  Constitutional: Negative for diaphoresis.  HENT: Positive for  sinus pressure and sinus pain.   Respiratory: Positive for shortness of breath.   Cardiovascular: Positive for chest pain. Negative for leg swelling.  Gastrointestinal: Positive for diarrhea.  All other systems reviewed and are negative.    Physical Exam Updated Vital Signs BP (!) 172/94   Pulse (!) 108   Temp 98.5 F (36.9 C) (Oral)   Resp 16   Ht 1.829 m (6')   Wt 88.5 kg   SpO2 99%   BMI 26.45 kg/m   Physical Exam CONSTITUTIONAL: Well developed/well nourished HEAD: Normocephalic/atraumatic EYES: EOMI ENMT: Mucous membranes moist NECK: supple no meningeal signs SPINE/BACK:entire spine nontender CV: S1/S2 noted, no murmurs/rubs/gallops noted LUNGS: Bibasilar crackles, no apparent distress ABDOMEN: soft, nontender, no rebound or guarding, bowel sounds noted throughout abdomen GU:no cva tenderness NEURO: Pt is awake/alert/appropriate, moves all extremitiesx4.  No facial droop.   EXTREMITIES: pulses normal/equal, full ROM no significant lower extremity edema SKIN: warm, color normal PSYCH: no abnormalities of mood noted, alert and oriented to situation   ED Treatments / Results  Labs (all labs ordered are listed, but only abnormal results are displayed) Labs Reviewed  BASIC METABOLIC PANEL - Abnormal; Notable for the following components:      Result Value   Glucose, Bld 128 (*)    All other components within normal limits  TROPONIN I - Abnormal; Notable for the following components:   Troponin I 0.21 (*)  All other components within normal limits  BRAIN NATRIURETIC PEPTIDE - Abnormal; Notable for the following components:   B Natriuretic Peptide 624.3 (*)    All other components within normal limits  D-DIMER, QUANTITATIVE (NOT AT [facility]) - Abnormal; Notable for the following components:   D-Dimer, Quant 1.44 (*)    All other components within normal limits  CBC  CK    EKG None  Radiology Dg Chest 2 View  Result Date: 04/05/2018 CLINICAL DATA:  Chest  pain EXAM: CHEST - 2 VIEW COMPARISON:  01/19/2013 FINDINGS: The lungs are well inflated. The cardiomediastinal contours are normal. There is pulmonary vascular congestion without overt edema. There is no pleural effusion or pneumothorax. IMPRESSION: Pulmonary vascular congestion without overt pulmonary edema. Electronically Signed   By: Deatra RobinsonKevin  Herman M.D.   On: 04/05/2018 23:10    Procedures Procedures   CRITICAL CARE Performed by: Joya Gaskinsonald W Farouk Vivero Total critical care time: 35 minutes Critical care time was exclusive of separately billable procedures and treating other patients. Critical care was necessary to treat or prevent imminent or life-threatening deterioration. Critical care was time spent personally by me on the following activities: development of treatment plan with patient and/or surrogate as well as nursing, discussions with consultants, evaluation of patient's response to treatment, examination of patient, obtaining history from patient or surrogate, ordering and performing treatments and interventions, ordering and review of laboratory studies, ordering and review of radiographic studies, pulse oximetry and re-evaluation of patient's condition. Patient with non-STEMI requiring admission to the hospital and cardiology consultation.  Patient required aspirin and nitroglycerin  ED ECG REPORT   Date: 04/05/2018 2231  Rate: 99  Rhythm: normal sinus rhythm  QRS Axis: normal  Intervals: normal  ST/T Wave abnormalities: normal  Conduction Disutrbances:nonspecific intraventricular conduction delay  Narrative Interpretation:   Old EKG Reviewed: none available  I have personally reviewed the EKG tracing and agree with the computerized printout as noted.  Medications Ordered in ED Medications  aspirin chewable tablet 324 mg (324 mg Oral Given 04/06/18 0020)  nitroGLYCERIN (NITROGLYN) 2 % ointment 1 inch (1 inch Topical Given 04/06/18 0021)  iopamidol (ISOVUE-370) 76 % injection 75  mL (75 mLs Intravenous Contrast Given 04/06/18 0118)     Initial Impression / Assessment and Plan / ED Course  I have reviewed the triage vital signs and the nursing notes.  Pertinent labs & imaging results that were available during my care of the patient were reviewed by me and considered in my medical decision making (see chart for details).     12:26 AM Patient presents with recent chest pain and shortness of breath.  He is currently chest pain-free.  He reports significant episode of sinusitis prior to this.  He has an elevated troponin.  He also reports his PCP told him he had elevated d-dimer.  Strong suspicion this is ACS, but myocarditis is a possibility.  I will also need to recheck his d-dimer as I do not have the results from his PCP. 2:17 AM Patient did have an elevated d-dimer, but CT chest was negative for PE.  Differential still includes ACS versus myocarditis.  Patient is currently chest pain-free. I discussed the case with Dr. Gilmore LarocheAkhtar with cardiology He will evaluate patient.  Patient and wife have been updated on plan. Final Clinical Impressions(s) / ED Diagnoses   Final diagnoses:  Non-STEMI (non-ST elevated myocardial infarction) (HCC)  Acute congestive heart failure, unspecified heart failure type [facility](HCC)    ED Discharge Orders  None       Zadie Rhine, MD 04/06/18 [phone]

## 2018-04-06 NOTE — Progress Notes (Signed)
ANTICOAGULATION CONSULT NOTE - Initial Consult  Pharmacy Consult for Heparin  Indication: chest pain/ACS  Allergies  Allergen Reactions  . Codeine Nausea Only    Patient Measurements: Height: 6' (182.9 cm) Weight: 195 lb (88.5 kg) IBW/kg (Calculated) : 77.6  Vital Signs: Temp: 98.5 F (36.9 C) (12/23 2226) Temp Source: Oral (12/23 2226) BP: 119/75 (12/24 0430) Pulse Rate: 86 (12/24 0430)  Labs: Recent Labs    04/05/18 2242 04/05/18 2258  HGB 13.1  --   HCT 40.3  --   PLT 366  --   CREATININE 0.93  --   CKTOTAL  --  82  TROPONINI 0.21*  --     Estimated Creatinine Clearance: 81.1 mL/min (by C-G formula based on SCr of 0.93 mg/dL).  Assessment: 72 y/o M with new onset shortness of breath and chest pain, troponin mildly elevated at 0.21, D-dimer was also mildly elevated but CT angio was negative for PE. Starting heparin per pharmacy. CBC/renal function good. PTA meds reviewed.   Goal of Therapy:  Heparin level 0.3-0.7 units/ml Monitor platelets by anticoagulation protocol: Yes   Plan:  Heparin 4000 units BOLUS Start heparin drip at 1100 units/hr 1430 HL Daily CBC/HL Monitor for bleeding   Jaime Clements, Jaime Clements 04/06/2018,5:49 AM

## 2018-04-06 NOTE — Discharge Summary (Addendum)
Discharge Summary    Patient ID: Jaime Clements,  MRN: [id number], DOB/AGE: [birth year] 72 y.o.  Admit date: 04/05/2018 Discharge date: 04/06/2018  Primary Care Provider: Patient, No Pcp Per Primary Cardiologist: Kristeen Miss, MD  Discharge Diagnoses    Principal Problem:   NSTEMI (non-ST elevated myocardial infarction) [facility]) Active Problems:   Elevated troponin   Hyperlipidemia   Coronary artery disease involving native coronary artery - non-obstructive on cath 04/06/18   Allergies Allergies  Allergen Reactions  . Codeine Nausea Only    Diagnostic Studies/Procedures    Left heart catheterization 04/06/18:  Prox RCA lesion is 20% stenosed.  Mid RCA lesion is 20% stenosed.  Prox Cx lesion is 20% stenosed.  Ost LAD to Prox LAD lesion is 10% stenosed.   1. Mild non-obstructive CAD 2. Normal filling pressures  Recommendations: Medical management of mild CAD with ASA and statin.   Echocardiogram 04/06/18: Study Conclusions  - Left ventricle: The cavity size was normal. There was mild   concentric hypertrophy. Systolic function was normal. The   estimated ejection fraction was in the range of 60% to 65%. Wall   motion was normal; there were no regional wall motion   abnormalities. The study is not technically sufficient to allow   evaluation of LV diastolic function. - Aortic valve: Transvalvular velocity was within the normal range.   There was no stenosis. There was no regurgitation. - Mitral valve: Transvalvular velocity was within the normal range.   There was no evidence for stenosis. There was mild regurgitation.   Valve area by pressure half-time: 2.5 cm^2. Valve area by   continuity equation (using LVOT flow): 0.48 cm^2. - Right ventricle: The cavity size was normal. Wall thickness was   normal. Systolic function was normal. - Tricuspid valve: There was no regurgitation. - Pericardium, extracardiac: A trivial pericardial effusion was  identified. _____________   History of Present Illness     72 y.o. male with a history of heart burn, hyperlipidemia and BPH who presents to the hospital with complaints of shortness of breath for 10 days. The patient reports that he has been struggling with a sinus infection for more than a week. He had some mild headaches and sinus congestion with a non-productive cough. He denied any fevers or chills. Since last Thursday (about 5 days ago) he has noticed resting shortness of breath and a dull ache over the center of the chest. He did not have any orthopnea, PND or lower extremity edema. He also did not report any palpitations, presyncope or syncope. He was prescribed Augmentin without any appreciable improvement in his symptoms. The chest ache got worse 1 day ago and he finally presented to the hospital for further work up.  In the ED the ECG revealed sinus rhythm, rate 99 bpm, with a right bundle branch block and non-specific ST changes. On telemetry he was documented to be mostly in sinus tachycardia (rate in the low 100s).  His labs were as follows: Troponin 0.21 CBC W 9.7, Hgb 13.1, Hct 40.3, Plt 366 BMP Na 140, K 3.5, Gl 128, Cr 0.93 CK 82 D-dimer 1.44 BNP 624.3   Hospital Course     Consultants: None   1. Chest pain and SOB: patient presented with progressive SOB and chest pain. No prior cardiac disease. Recent viral illness. BNP was elevated to 624. Trop 0.21>0.28>0.23. CK wnl. Ddimer elevated. Lipids with LDL 85, total cholesterol 409. CXR and CT chest with mild vascular congestion; no PE  and no coronary calcifications noted. EKG with sinus rhythm with incomplete RBBB and PACs; QTC 486. He was given ASA and SL nitro in the ED. Started on a heparin gtt, high intensity statin. Patient had an echocardiogram which showed EF 60-65%, no wall motion abnormalities, no significant valvular abnormalities, and trivial pericardial effusion. He underwent a LHC which showed mild non-obstructive  CAD. He was recommended to take aspirin 81mg  daily and continue statin therapy for risk factor modifications. It was felt his symptoms might be a mild case of pericarditis. He was recommended to take ibuprofen 400mg  TID x2 weeks.  - Continue aspirin  - Will increase atorvastatin to 20 mg daily - Continue 2 week course of ibuprofen 400mg  TID then stop.   2. HLD: LDL 85 on lipid panel this morning. His atorvastatin was increased to 20mg  daily from 10mg  daily for better cholesterol control. - Continue atorvastatin 20mg  daily   _____________  Discharge Vitals Blood pressure 118/65, pulse 74, temperature 97.9 F (36.6 C), resp. rate 15, height 6' (1.829 m), weight 88.5 kg, SpO2 97 %.  Filed Weights   04/05/18 2226  Weight: 88.5 kg    Labs & Radiologic Studies    CBC Recent Labs    04/05/18 2242 04/06/18 0647  WBC 9.7 9.8  HGB 13.1 12.8*  HCT 40.3 37.7*  MCV 93.7 92.6  PLT 366 377   Basic Metabolic Panel Recent Labs    16/10/96 2242 04/06/18 0647  NA 140 141  K 3.5 3.7  CL 105 105  CO2 25 24  GLUCOSE 128* 116*  BUN 11 10  CREATININE 0.93 0.92  CALCIUM 9.1 8.6*   Liver Function Tests No results for input(s): AST, ALT, ALKPHOS, BILITOT, PROT, ALBUMIN in the last 72 hours. No results for input(s): LIPASE, AMYLASE in the last 72 hours. Cardiac Enzymes Recent Labs    04/05/18 2242 04/05/18 2258 04/06/18 0647 04/06/18 1050  CKTOTAL  --  82  --   --   TROPONINI 0.21*  --  0.28* 0.23*   BNP Invalid input(s): POCBNP D-Dimer Recent Labs    04/05/18 2258  DDIMER 1.44*   Hemoglobin A1C No results for input(s): HGBA1C in the last 72 hours. Fasting Lipid Panel Recent Labs    04/06/18 0647  CHOL 119  HDL 23*  LDLCALC 85  TRIG 53  CHOLHDL 5.2   Thyroid Function Tests Recent Labs    04/06/18 0647  TSH 3.248   _____________  Dg Chest 2 View  Result Date: 04/05/2018 CLINICAL DATA:  Chest pain EXAM: CHEST - 2 VIEW COMPARISON:  01/19/2013 FINDINGS: The  lungs are well inflated. The cardiomediastinal contours are normal. There is pulmonary vascular congestion without overt edema. There is no pleural effusion or pneumothorax. IMPRESSION: Pulmonary vascular congestion without overt pulmonary edema. Electronically Signed   By: Deatra Robinson M.D.   On: 04/05/2018 23:10   Ct Angio Chest Pe W And/or Wo Contrast  Result Date: 04/06/2018 CLINICAL DATA:  Chest pain and shortness of breath for several days EXAM: CT ANGIOGRAPHY CHEST WITH CONTRAST TECHNIQUE: Multidetector CT imaging of the chest was performed using the standard protocol during bolus administration of intravenous contrast. Multiplanar CT image reconstructions and MIPs were obtained to evaluate the vascular anatomy. CONTRAST:  75mL ISOVUE-370 IOPAMIDOL (ISOVUE-370) INJECTION 76% COMPARISON:  Plain film from earlier in the same day. FINDINGS: Cardiovascular: Thoracic aorta is within normal limits. No aneurysmal dilatation or dissection is seen. No cardiac enlargement is noted. No coronary calcifications are seen.  The pulmonary artery demonstrates a normal branching pattern. No filling defects to suggest pulmonary emboli are identified. Mild central vascular congestion is noted similar to that seen on prior plain film. Mediastinum/Nodes: The thoracic inlet is within normal limits. No significant mediastinal adenopathy is noted. Bilateral hilar lymph nodes are seen likely of a reactive nature. A few calcified mediastinal nodes are seen consistent with prior granulomatous disease. Lungs/Pleura: The lungs are well aerated bilaterally. No focal confluent infiltrate is seen. Scattered calcified and noncalcified nodules are noted measuring less than 5 mm consistent with prior granulomatous disease. Upper Abdomen: Visualized upper abdomen is within normal limits. Musculoskeletal: No acute bony abnormality is noted. Review of the MIP images confirms the above findings. IMPRESSION: No evidence of pulmonary emboli.  Mild vascular congestion is noted. Changes consistent with prior granulomatous disease Electronically Signed   By: Alcide CleverMark  Lukens M.D.   On: 04/06/2018 01:44   Disposition   Patient was seen and examined by Dr. Elease HashimotoNahser who deemed patient as stable for discharge. Follow-up has been arranged. Discharge medications as listed below.   Follow-up Plans & Appointments    Follow-up Information    [facility] [address] Follow up.   Specialty:  Cardiology Why:  Our office will contact you directly with an appointment date/time to follow-up in the next 2 weeks.  Contact information: [address], [address] [place] [id number] [phone]           Discharge Medications   Allergies as of 04/06/2018      Reactions   Codeine Nausea Only      Medication List    STOP taking these medications   amoxicillin-clavulanate 875-125 MG tablet Commonly known as:  AUGMENTIN     TAKE these medications   aspirin 81 MG EC tablet Take 1 tablet (81 mg total) by mouth daily. Start taking on:  April 07, 2018   atorvastatin 20 MG tablet Commonly known as:  LIPITOR Take 0.5 tablets (10 mg total) by mouth daily. What changed:  medication strength   clobetasol ointment 0.05 % Commonly known as:  TEMOVATE Apply 1 application topically 2 (two) times daily as needed (rash).   Docosahexaenoic Acid 300 MG Caps Take 300 mg by mouth 2 (two) times daily.   famotidine 20 MG tablet Commonly known as:  PEPCID Take 20 mg by mouth 2 (two) times daily.   Flax Seed Oil 1000 MG Caps Take 1 capsule by mouth daily.   ibuprofen 200 MG tablet Commonly known as:  ADVIL,MOTRIN Take 2 tablets (400 mg total) by mouth 3 (three) times daily for 14 days.   ICAPS MV PO Take 1 capsule by mouth 2 (two) times daily.   tadalafil 5 MG tablet Commonly known as:  CIALIS Take 5 mg by mouth daily as needed for erectile dysfunction.   traZODone 50 MG tablet Commonly known as:   DESYREL Take 50 mg by mouth at bedtime as needed for sleep.        Outstanding Labs/Studies   None  Duration of Discharge Encounter   Greater than 30 minutes including physician time.  Signed, Beatriz StallionKrista M. Kroeger PA-C 04/06/2018, 5:39 PM   Attending Note:   The patient was seen and examined.  Agree with assessment and plan as noted above.  Changes made to the above note as needed.  Patient seen and independently examined with Judy PimpleKrista Kroeger, PA .   We discussed all aspects of the encounter. I agree with the assessment and plan  as stated above.  1.  Shortness of breath: The patient presented with shortness of breath.  He was found to have mild elevation of his troponin levels. Heart catheterization revealed minimal coronary artery disease. He we started him on aspirin 81 mg a day.  We started him on a statin. We will follow-up with him in the office. Patient is stable for discharge.   I have spent a total of 40 minutes with patient reviewing hospital  notes , telemetry, EKGs, labs and examining patient as well as establishing an assessment and plan that was discussed with the patient. > 50% of time was spent in direct patient care.    Vesta MixerPhilip J. Nahser, Montez HagemanJr., MD, [facility] 04/06/2018, 5:45 PM [address],  [address] Office [phone] Pager [phone]

## 2018-04-06 NOTE — Interval H&P Note (Signed)
History and Physical Interval Note:  04/06/2018 2:30 PM  Jeanine Luzlaude P [address] FilterRagan  has presented today for cardiac cath with the diagnosis of nstemi. The various methods of treatment have been discussed with the patient and family. After consideration of risks, benefits and other options for treatment, the patient has consented to  Procedure(s): LEFT HEART CATH AND CORONARY ANGIOGRAPHY (N/A) as a surgical intervention .  The patient's history has been reviewed, patient examined, no change in status, stable for surgery.  I have reviewed the patient's chart and labs.  Questions were answered to the patient's satisfaction.    Cath Lab Visit (complete for each Cath Lab visit)  Clinical Evaluation Leading to the Procedure:   ACS: Yes.    Non-ACS:    Anginal Classification: CCS II  Anti-ischemic medical therapy: No Therapy  Non-Invasive Test Results: No non-invasive testing performed  Prior CABG: No previous CABG         Verne Carrowhristopher Brion Hedges

## 2018-04-06 NOTE — H&P (View-Only) (Signed)
Progress Note  Patient Name: Cline CoolsClaude P Gesell Date of Encounter: 04/06/2018  Primary Cardiologist: [facility]; Dr. Elease HashimotoNahser  Subjective   Patient reports no trouble with breathing since arriving to the ER. Chest pain was very mild on arrival and gone at this time. Only complaint is a headache for which he request ibuprofen.   He complains of not being able to sleep while here in the hospital.  He is not having any chest pain.  His main complaint is 10 days of shortness of breath.  This started as a sinus infection.  He   denies any fever.  He was found to have an elevated d-dimer in the emergency room. CT angiogram of the chest reveals no evidence of pulmonary emboli.  The cardiac size was normal.  Inpatient Medications    Scheduled Meds: . [START ON 04/07/2018] aspirin EC  81 mg Oral Daily  . atorvastatin  80 mg Oral q1800  . metoprolol tartrate  12.5 mg Oral BID  . pantoprazole  20 mg Oral BID   Continuous Infusions: . heparin 1,100 Units/hr (04/06/18 [id number])   PRN Meds: acetaminophen, nitroGLYCERIN, ondansetron (ZOFRAN) IV   Vital Signs    Vitals:   04/06/18 0345 04/06/18 0400 04/06/18 0430 04/06/18 0730  BP: (!) 151/82 138/86 119/75 111/72  Pulse: (!) 104 99 86 86  Resp: 13 15 13 12   Temp:    97.9 F (36.6 C)  TempSrc:    Oral  SpO2: 95% 95% 93% 95%  Weight:      Height:        Intake/Output Summary (Last 24 hours) at 04/06/2018 0753 Last data filed at 04/06/2018 0447 Gross per 24 hour  Intake -  Output 800 ml  Net -800 ml   Filed Weights   04/05/18 2226  Weight: 88.5 kg    Telemetry    Sinus rhythm/ sinus bradycardia - Personally Reviewed  ECG    Sinus rhythm with PACs, incomplete RBBB - Personally Reviewed  Physical Exam   GEN: Laying in bed in no acute distress.   Neck: No JVD, no carotid bruits Cardiac: RRR, no murmurs, rubs, or gallops.  Respiratory: Clear to auscultation bilaterally, no wheezes/ rales/ rhonchi GI: NABS, Soft,  nontender, non-distended  MS: No edema; No deformity. Neuro:  Nonfocal, moving all extremities spontaneously Psych: Normal affect   Labs    Chemistry Recent Labs  Lab 04/05/18 2242  NA 140  K 3.5  CL 105  CO2 25  GLUCOSE 128*  BUN 11  CREATININE 0.93  CALCIUM 9.1  GFRNONAA >60  GFRAA >60  ANIONGAP 10     Hematology Recent Labs  Lab 04/05/18 2242 04/06/18 0647  WBC 9.7 9.8  RBC 4.30 4.07*  HGB 13.1 12.8*  HCT 40.3 37.7*  MCV 93.7 92.6  MCH 30.5 31.4  MCHC 32.5 34.0  RDW 13.2 13.2  PLT 366 377    Cardiac Enzymes Recent Labs  Lab 04/05/18 2242  TROPONINI 0.21*   No results for input(s): TROPIPOC in the last 168 hours.   BNP Recent Labs  Lab 04/05/18 2258  BNP 624.3*     DDimer  Recent Labs  Lab 04/05/18 2258  DDIMER 1.44*     Radiology    Dg Chest 2 View  Result Date: 04/05/2018 CLINICAL DATA:  Chest pain EXAM: CHEST - 2 VIEW COMPARISON:  01/19/2013 FINDINGS: The lungs are well inflated. The cardiomediastinal contours are normal. There is pulmonary vascular congestion without overt edema. There  is no pleural effusion or pneumothorax. IMPRESSION: Pulmonary vascular congestion without overt pulmonary edema. Electronically Signed   By: Deatra Robinson M.D.   On: 04/05/2018 23:10   Ct Angio Chest Pe W And/or Wo Contrast  Result Date: 04/06/2018 CLINICAL DATA:  Chest pain and shortness of breath for several days EXAM: CT ANGIOGRAPHY CHEST WITH CONTRAST TECHNIQUE: Multidetector CT imaging of the chest was performed using the standard protocol during bolus administration of intravenous contrast. Multiplanar CT image reconstructions and MIPs were obtained to evaluate the vascular anatomy. CONTRAST:  75mL ISOVUE-370 IOPAMIDOL (ISOVUE-370) INJECTION 76% COMPARISON:  Plain film from earlier in the same day. FINDINGS: Cardiovascular: Thoracic aorta is within normal limits. No aneurysmal dilatation or dissection is seen. No cardiac enlargement is noted. No  coronary calcifications are seen. The pulmonary artery demonstrates a normal branching pattern. No filling defects to suggest pulmonary emboli are identified. Mild central vascular congestion is noted similar to that seen on prior plain film. Mediastinum/Nodes: The thoracic inlet is within normal limits. No significant mediastinal adenopathy is noted. Bilateral hilar lymph nodes are seen likely of a reactive nature. A few calcified mediastinal nodes are seen consistent with prior granulomatous disease. Lungs/Pleura: The lungs are well aerated bilaterally. No focal confluent infiltrate is seen. Scattered calcified and noncalcified nodules are noted measuring less than 5 mm consistent with prior granulomatous disease. Upper Abdomen: Visualized upper abdomen is within normal limits. Musculoskeletal: No acute bony abnormality is noted. Review of the MIP images confirms the above findings. IMPRESSION: No evidence of pulmonary emboli. Mild vascular congestion is noted. Changes consistent with prior granulomatous disease Electronically Signed   By: Alcide Clever M.D.   On: 04/06/2018 01:44    Cardiac Studies   Echo pending  Patient Profile     72 y.o. male with PMH of HLD, BPH, and GERD, who presented to the hospital with SOB and chest pain.   Assessment & Plan    1. Chest pain and SOB: patient presented with progressive SOB and chest pain. No prior cardiac disease. Recent viral illness. CXR and CT chest with mild vascular congestion; no PE and no coronary calcifications noted. BNP was elevated to 624. Trop 0.21>0.28. CK wnl. Lipids with LDL 85, total cholesterol 086. EKG with sinus rhythm with incomplete RBBB and PACs; QTC 486. He was given ASA and SL nitro in the ED. Started on a heparin gtt, high intensity statin, and metoprolol 12.5mg  BID. Differential includes ACS, acute CHF, myocarditis given recent illness. He appears euvolemic on exam with clear lungs.  - Echo pending to assess LV function and wall  motion abnormalities.  - Continue to trend trop to peak - Can continue heparin gtt for now - Will hold metoprolol at this time pending echo results and overnight baseline bradycardia.  - No indication for lasix given clear lungs on exam and no complaints of SOB at this time - low threshold for 1x dose if SOB returns.  - Further ischemic evaluation and medication adjustments pending echo.   2. HLD: LDL 85 on lipid panel this morning. His atorvastatin was increased to 80mg  daily from 10mg  daily given concerns for ACS.  - Continue high intensity statin for now  3. Headache: likely attributed to SL nitro and lack of sleep.  - Will give one time dose of ibuprofen as this usually does the trick at home.     For questions or updates, please contact [facility] Please consult [email or web address] for contact info under Cardiology/STEMI.  Signed, Beatriz StallionKrista M. Kroeger, PA-C  04/06/2018, 7:53 AM   [phone]  Attending Note:   The patient was seen and examined.  Agree with assessment and plan as noted above.  Changes made to the above note as needed.  Patient seen and independently examined with Judy PimpleKrista Kroeger, PA .   We discussed all aspects of the encounter. I agree with the assessment and plan as stated above.  1.  Shortness of breath: We are awaiting an echocardiogram this morning.  His cardiac exam is normal.  He does not have any JVD, no leg edema and his lungs are clear.  It is difficult to imagine that he has significant congestive heart failure.  At this point we do not have an explanation for his mildly elevated troponin levels.  I would have a low threshold to proceed with heart catheterization Discussed the risks, benefits, options of heart catheterization.  He understands and agrees to proceed if we make that recommendation.   2.  Elevated troponin level.  He has mild ST segment elevation in leads aVR.  No other acute changes.  We will continue to follow.`    I have spent a  total of 40 minutes with patient reviewing hospital  notes , telemetry, EKGs, labs and examining patient as well as establishing an assessment and plan that was discussed with the patient. > 50% of time was spent in direct patient care.    Vesta MixerPhilip J. Tyrion Glaude, Montez HagemanJr., MD, [facility] 04/06/2018, 9:42 AM [address],  [address] Office [phone] Pager [phone]

## 2018-04-06 NOTE — Discharge Instructions (Signed)
PLEASE REMEMBER TO BRING ALL OF YOUR MEDICATIONS TO EACH OF YOUR FOLLOW-UP OFFICE VISITS.  PLEASE ATTEND ALL SCHEDULED FOLLOW-UP APPOINTMENTS.   Activity: Increase activity slowly as tolerated. You may shower, but no soaking baths (or swimming) for 1 week. No driving for 24 hours. No lifting over 5 lbs for 1 week. No sexual activity for 1 week.   Wound Care: You may wash cath site gently with soap and water. Keep cath site clean and dry. If you notice pain, swelling, bleeding or pus at your cath site, please call [phone].  It is possible your symptoms were brought on by mild inflammation around your heart. Please take over-the-counter ibuprofen 400mg  3 times per day for the next 2 weeks. Take this medication with food to prevent stomach irritation. We will see you in our clinic outpatient in 2 weeks to follow-up on your symptoms - our office will contact you directly with an appointment date/time.

## 2018-04-06 NOTE — Progress Notes (Signed)
  Echocardiogram 2D Echocardiogram has been performed.  Rye Decoste T Mikhia Dusek 04/06/2018, 10:53 AM

## 2018-04-06 NOTE — H&P (Signed)
Cardiology Admission History and Physical:   Patient ID: Jaime CoolsClaude P Ribera; MRN: [id number]; DOB: 11/02/1945   Admission date: 04/05/2018  Primary Care Provider:  Patient, No Pcp Per Primary Cardiologist:  No primary care provider on file   Chief Complaint:    Shortness of breath  History of Present Illness:   Jaime Clements is a 72 y.o. male with a history of heart burn, hyperlipidemia and BPH who presents to the hospital with complaints of shortness of breath for 10 days. The patient reports that he has been struggling with a sinus infection for more than a week. He had some mild headaches and sinus congestion with a non-productive cough. He denied any fevers or chills. Since last Thursday (about 5 days ago) he has noticed resting shortness of breath and a dull ache over the center of the chest. He did not have any orthopnea, PND or lower extremity edema. He also did not report any palpitations, presyncope or syncope. He was prescribed Augmentin without any appreciable improvement in his symptoms. The chest ache got worse 1 day ago and he finally presented to the hospital for further work up.  In the ED the ECG revealed sinus rhythm, rate 99 bpm, with a right bundle branch block and non-specific ST changes. On telemetry he was documented to be mostly in sinus tachycardia (rate in the low 100s).  His labs were as follows: Troponin 0.21 CBC W 9.7, Hgb 13.1, Hct 40.3, Plt 366 BMP Na 140, K 3.5, Gl 128, Cr 0.93 CK 82 D-dimer 1.44 BNP 624.3    PAST MEDICAL HISTORY Hyperlipidemia BPH Heartburn   PAST SURGICAL HISTORY None    Medications Prior to Admission: Prior to Admission medications   Medication Sig Start Date End Date Taking? Authorizing Provider  amoxicillin-clavulanate (AUGMENTIN) 875-125 MG tablet Take 1 tablet by mouth 2 (two) times daily.   Yes [provider]  atorvastatin (LIPITOR) 10 MG tablet Take 10 mg by mouth daily.   Yes [provider]    clobetasol ointment (TEMOVATE) 0.05 % Apply 1 application topically 2 (two) times daily as needed (rash).   Yes [provider]  Docosahexaenoic Acid 300 MG CAPS Take 300 mg by mouth 2 (two) times daily.   Yes [provider]  famotidine (PEPCID) 20 MG tablet Take 20 mg by mouth 2 (two) times daily.   Yes [provider]  Flaxseed, Linseed, (FLAX SEED OIL) 1000 MG CAPS Take 1 capsule by mouth daily.   Yes [provider]  Multiple Vitamins-Minerals (ICAPS MV PO) Take 1 capsule by mouth 2 (two) times daily.   Yes [provider]  tadalafil (CIALIS) 5 MG tablet Take 5 mg by mouth daily as needed for erectile dysfunction.   Yes [provider]  traZODone (DESYREL) 50 MG tablet Take 50 mg by mouth at bedtime as needed for sleep.   Yes [provider]     Allergies:    Allergies  Allergen Reactions  . Codeine Nausea Only    Social History:  He is a non-smoker and works as a Scientist, physiologicalsychologist  Social History   Socioeconomic History  . Marital status: Single    Spouse name: Not on file  . Number of children: Not on file  . Years of education: Not on file  . Highest education level: Not on file  Occupational History  . Not on file  Social Needs  . Financial resource strain: Not on file  . Food insecurity:  Worry: Not on file    Inability: Not on file  . Transportation needs:    Medical: Not on file    Non-medical: Not on file  Tobacco Use  . Smoking status: Never Smoker  . Smokeless tobacco: Never Used  Substance and Sexual Activity  . Alcohol use: Yes  . Drug use: Not on file  . Sexual activity: Not on file  Lifestyle  . Physical activity:    Days per week: Not on file    Minutes per session: Not on file  . Stress: Not on file  Relationships  . Social connections:    Talks on phone: Not on file    Gets together: Not on file    Attends religious service: Not on file    Active member of club or organization: Not  on file    Attends meetings of clubs or organizations: Not on file    Relationship status: Not on file  . Intimate partner violence:    Fear of current or ex partner: Not on file    Emotionally abused: Not on file    Physically abused: Not on file    Forced sexual activity: Not on file  Other Topics Concern  . Not on file  Social History Narrative  . Not on file     Family History:   The patient's family history is not on file.  There is no history of premature CAD in the family.   Review of Systems: [y] = yes, [ ]  = no   . General: Weight gain [ ] ; Weight loss [ ] ; Anorexia [ ] ; Fatigue Cove.Etienne ]; Fever [ ] ; Chills [ ] ; Weakness Cove.Etienne ]  . Cardiac: Chest pain/pressure Cove.Etienne ]; Resting SOB Cove.Etienne ]; Exertional SOB [ ] ; Orthopnea [ ] ; Pedal Edema [ ] ; Palpitations [ ] ; Syncope [ ] ; Presyncope [ ] ; Paroxysmal nocturnal dyspnea[ ]   . Pulmonary: Cough [ ] ; Wheezing[ ] ; Hemoptysis[ ] ; Sputum [ ] ; Snoring [ ]   . GI: Vomiting[ ] ; Dysphagia[ ] ; Melena[ ] ; Hematochezia [ ] ; Heartburn[ ] ; Abdominal pain [ ] ; Constipation [ ] ; Diarrhea [ ] ; BRBPR [ ]   . GU: Hematuria[ ] ; Dysuria [ ] ; Nocturia[ ]   . Vascular: Pain in legs with walking [ ] ; Pain in feet with lying flat [ ] ; Non-healing sores [ ] ; Stroke [ ] ; TIA [ ] ; Slurred speech [ ] ;  . Neuro: Headaches[ ] ; Vertigo[ ] ; Seizures[ ] ; Paresthesias[ ] ;Blurred vision [ ] ; Diplopia [ ] ; Vision changes [ ]   . Ortho/Skin: Arthritis [ ] ; Joint pain [ ] ; Muscle pain [ ] ; Joint swelling [ ] ; Back Pain [ ] ; Rash [ ]   . Psych: Depression[ ] ; Anxiety[ ]   . Heme: Bleeding problems [ ] ; Clotting disorders [ ] ; Anemia [ ]   . Endocrine: Diabetes [ ] ; Thyroid dysfunction[ ]      Physical Exam/Data:   Vitals:   04/05/18 2226 04/05/18 2354 04/06/18 0000 04/06/18 0221  BP: (!) 173/91 (!) 172/94 (!) 169/92 (!) 146/90  Pulse: (!) 108  (!) 108 (!) 108  Resp: 16  18 15   Temp: 98.5 F (36.9 C)     TempSrc: Oral     SpO2: 99%  98% 94%  Weight: 88.5 kg     Height: 6' (1.829  m)      No intake or output data in the 24 hours ending 04/06/18 0255 Filed Weights   04/05/18 2226  Weight: 88.5 kg   Body mass index is 26.45 kg/m.  General:  Well nourished, well developed, in no acute distress HEENT: normal Lymph: no adenopathy Neck: no JVD Endocrine:  No thryomegaly Vascular: No carotid bruits; FA pulses 2+ bilaterally without bruits  Cardiac:  normal S1, S2; RRR; no murmur  Lungs:  clear to auscultation bilaterally, no wheezing, rhonchi or rales  Abd: soft, nontender, no hepatomegaly  Ext: no edema Musculoskeletal:  No deformities, BUE and BLE strength normal and equal Skin: warm and dry  Neuro:  CNs 2-12 intact, no focal abnormalities noted Psych:  Normal affect     Laboratory Data:  Chemistry Recent Labs  Lab 04/05/18 2242  NA 140  K 3.5  CL 105  CO2 25  GLUCOSE 128*  BUN 11  CREATININE 0.93  CALCIUM 9.1  GFRNONAA >60  GFRAA >60  ANIONGAP 10    No results for input(s): PROT, ALBUMIN, AST, ALT, ALKPHOS, BILITOT in the last 168 hours. Hematology Recent Labs  Lab 04/05/18 2242  WBC 9.7  RBC 4.30  HGB 13.1  HCT 40.3  MCV 93.7  MCH 30.5  MCHC 32.5  RDW 13.2  PLT 366   Cardiac Enzymes Recent Labs  Lab 04/05/18 2242  TROPONINI 0.21*   No results for input(s): TROPIPOC in the last 168 hours.  BNP Recent Labs  Lab 04/05/18 2258  BNP 624.3*    DDimer  Recent Labs  Lab 04/05/18 2258  DDIMER 1.44*    Radiology/Studies:  Dg Chest 2 View  Result Date: 04/05/2018 CLINICAL DATA:  Chest pain EXAM: CHEST - 2 VIEW COMPARISON:  01/19/2013 FINDINGS: The lungs are well inflated. The cardiomediastinal contours are normal. There is pulmonary vascular congestion without overt edema. There is no pleural effusion or pneumothorax. IMPRESSION: Pulmonary vascular congestion without overt pulmonary edema. Electronically Signed   By: Deatra RobinsonKevin  Herman M.D.   On: 04/05/2018 23:10   Ct Angio Chest Pe W And/or Wo Contrast  Result Date:  04/06/2018 CLINICAL DATA:  Chest pain and shortness of breath for several days EXAM: CT ANGIOGRAPHY CHEST WITH CONTRAST TECHNIQUE: Multidetector CT imaging of the chest was performed using the standard protocol during bolus administration of intravenous contrast. Multiplanar CT image reconstructions and MIPs were obtained to evaluate the vascular anatomy. CONTRAST:  75mL ISOVUE-370 IOPAMIDOL (ISOVUE-370) INJECTION 76% COMPARISON:  Plain film from earlier in the same day. FINDINGS: Cardiovascular: Thoracic aorta is within normal limits. No aneurysmal dilatation or dissection is seen. No cardiac enlargement is noted. No coronary calcifications are seen. The pulmonary artery demonstrates a normal branching pattern. No filling defects to suggest pulmonary emboli are identified. Mild central vascular congestion is noted similar to that seen on prior plain film. Mediastinum/Nodes: The thoracic inlet is within normal limits. No significant mediastinal adenopathy is noted. Bilateral hilar lymph nodes are seen likely of a reactive nature. A few calcified mediastinal nodes are seen consistent with prior granulomatous disease. Lungs/Pleura: The lungs are well aerated bilaterally. No focal confluent infiltrate is seen. Scattered calcified and noncalcified nodules are noted measuring less than 5 mm consistent with prior granulomatous disease. Upper Abdomen: Visualized upper abdomen is within normal limits. Musculoskeletal: No acute bony abnormality is noted. Review of the MIP images confirms the above findings. IMPRESSION: No evidence of pulmonary emboli. Mild vascular congestion is noted. Changes consistent with prior granulomatous disease Electronically Signed   By: Alcide CleverMark  Lukens M.D.   On: 04/06/2018 01:44    Assessment and Plan:   1. New onset shortness of breath and chest pain  Given the patient's prodromal symptoms and new  onset CP/SOB there is a concern for this being either an acute coronary syndrome or some form  of a cardiomyopathy/myocarditis. His admission labs are abnormal with an elevated troponin, D-Dimer and BNP.   - Admit to stepdown/telemetry - Cycle cardiac biomarkers - Obtain serial ECGs - Transthoracic echocardiogram to evaluate LV function - Continue ASA - IV unfractionated heparin - Start low dose metoprolol - Atorvastatin high dose, check a lipid panel - Keep NPO - Can consider a cardiac cath if cardiac enzymes continue to trend up (or abnormal findings on Echo)   Severity of Illness: The appropriate patient status for this patient is INPATIENT. Inpatient status is judged to be reasonable and necessary in order to provide the required intensity of service to ensure the patient's safety. The patient's presenting symptoms, physical exam findings, and initial radiographic and laboratory data in the context of their chronic comorbidities is felt to place them at high risk for further clinical deterioration. Furthermore, it is not anticipated that the patient will be medically stable for discharge from the hospital within 2 midnights of admission. The following factors support the patient status of inpatient.   " The patient's presenting symptoms include SOB. " The physical exam findings include sinus tachycardia. " The initial radiographic and laboratory data are worrisome because of elevated troponin. " The chronic co-morbidities include hyperlipidemia.   * I certify that at the point of admission it is my clinical judgment that the patient will require inpatient hospital care spanning beyond 2 midnights from the point of admission due to high intensity of service, high risk for further deterioration and high frequency of surveillance required.*    For questions or updates, please contact [facility] Please consult [email or web address] for contact info under Cardiology/STEMI.    Signed, Lonie Peak, MD  04/06/2018 2:55 AM

## 2018-04-07 LAB — HEMOGLOBIN A1C
Hgb A1c MFr Bld: 5.6 % (ref 4.8–5.6)
Mean Plasma Glucose: 114 mg/dL

## 2018-04-08 ENCOUNTER — Encounter (HOSPITAL_COMMUNITY): Payer: Self-pay | Admitting: Cardiovascular Disease

## 2018-04-08 ENCOUNTER — Telehealth: Payer: Self-pay | Admitting: Cardiology

## 2018-04-08 NOTE — Telephone Encounter (Signed)
New Message       TOC APPT: 04/22/18 @9 :00 Jeraldine LootsHammond

## 2018-04-08 NOTE — Telephone Encounter (Signed)
[facility].  Patient understands to follow up with Lizabeth LeydenNina Hammond, NP on 04/22/18 at [facility] Patient understands discharge instructions? Yes Patient understands medications and regiment? Yes Patient understands to bring all medications to this visit? Yes  Patient states he continues to have SOB which he attributes to a sinus infection. He is aware to call back with questions or concerns prior to appointment.

## 2018-04-21 DIAGNOSIS — R079 Chest pain, unspecified: Secondary | ICD-10-CM | POA: Insufficient documentation

## 2018-04-21 NOTE — Progress Notes (Signed)
Cardiology Office Note:    Date:  04/22/2018   ID:  Jaime Clements, DOB [birth year], MRN [id number]  PCP:  Renford Dills, MD  Cardiologist:  Kristeen Miss, MD  Referring MD: No ref. provider found   Chief Complaint  Patient presents with  . Hospitalization Follow-up    Post cath    History of Present Illness:    Jaime Clements is a 73 y.o. male with a past medical history significant for heart burn, hyperlipidemia and BPH who presented to the hospital on 04/05/18 for complaints of progressive shortness of breath and chest pain. He had had a recent viral illness. BNP was 624 and troponins were mildly elevated in flat pattern. Chest CT was negative for PE but showed mild vascular congestion. Echo showed normal LV systolic function with EF 60-65% and no wall motion abnormalities or valve abnormalities. He underwent a LHC which showed mild non-obstructive CAD. He was recommended to take aspirin 81mg  daily and continue statin therapy for risk factor modifications. It was felt his symptoms might be a mild case of pericarditis. He was recommended to take ibuprofen 400mg  TID x2 weeks then stop.   Jaime Clements is here today alone for hosp fu. He is feeling very well with no further shortness of breath. He says his main problem was a sinus infection and it took about a week after discharge to get better but now he is back to normal and doing all of his daily activities. He is back at work as a Warden/ranger.    Past Surgical History:  Procedure Laterality Date  . LEFT HEART CATH AND CORONARY ANGIOGRAPHY N/A 04/06/2018   Procedure: LEFT HEART CATH AND CORONARY ANGIOGRAPHY;  Surgeon: Kathleene Hazel, MD;  Location: [facility];  Service: Cardiovascular;  Laterality: N/A;    Current Medications: Current Meds  Medication Sig  . aspirin EC 81 MG EC tablet Take 1 tablet (81 mg total) by mouth daily.  Marland Kitchen atorvastatin (LIPITOR) 20 MG tablet Take 0.5 tablets (10 mg total) by mouth daily.  .  clobetasol ointment (TEMOVATE) 0.05 % Apply 1 application topically 2 (two) times daily as needed (rash).  . Docosahexaenoic Acid 300 MG CAPS Take 300 mg by mouth 2 (two) times daily.  . famotidine (PEPCID) 20 MG tablet Take 20 mg by mouth 2 (two) times daily.  . Flaxseed, Linseed, (FLAX SEED OIL) 1000 MG CAPS Take 1 capsule by mouth daily.  . Multiple Vitamins-Minerals (ICAPS MV PO) Take 1 capsule by mouth 2 (two) times daily.  . tadalafil (CIALIS) 5 MG tablet Take 5 mg by mouth daily as needed for erectile dysfunction.  . traZODone (DESYREL) 50 MG tablet Take 50 mg by mouth at bedtime as needed for sleep.     Allergies:   Codeine   Social History   Socioeconomic History  . Marital status: Single    Spouse name: Not on file  . Number of children: Not on file  . Years of education: Not on file  . Highest education level: Not on file  Occupational History  . Not on file  Social Needs  . Financial resource strain: Not on file  . Food insecurity:    Worry: Not on file    Inability: Not on file  . Transportation needs:    Medical: Not on file    Non-medical: Not on file  Tobacco Use  . Smoking status: Never Smoker  . Smokeless tobacco: Never Used  Substance and Sexual Activity  .  Alcohol use: Yes  . Drug use: Not on file  . Sexual activity: Not on file  Lifestyle  . Physical activity:    Days per week: Not on file    Minutes per session: Not on file  . Stress: Not on file  Relationships  . Social connections:    Talks on phone: Not on file    Gets together: Not on file    Attends religious service: Not on file    Active member of club or organization: Not on file    Attends meetings of clubs or organizations: Not on file    Relationship status: Not on file  Other Topics Concern  . Not on file  Social History Narrative  . Not on file     Family History: The patient's family history includes Cervical cancer in his sister; Diabetes in his brother; Hypertension in his  mother; Prostate cancer in his father. ROS:   Please see the history of present illness.     All other systems reviewed and are negative.  EKGs/Labs/Other Studies Reviewed:    The following studies were reviewed today:  Left heart catheterization 04/06/18:  Prox RCA lesion is 20% stenosed.  Mid RCA lesion is 20% stenosed.  Prox Cx lesion is 20% stenosed.  Ost LAD to Prox LAD lesion is 10% stenosed.  1. Mild non-obstructive CAD 2. Normal filling pressures  Recommendations: Medical management of mild CAD with ASA and statin.   Echocardiogram 04/06/18: Study Conclusions  - Left ventricle: The cavity size was normal. There was mild concentric hypertrophy. Systolic function was normal. The estimated ejection fraction was in the range of 60% to 65%. Wall motion was normal; there were no regional wall motion abnormalities. The study is not technically sufficient to allow evaluation of LV diastolic function. - Aortic valve: Transvalvular velocity was within the normal range. There was no stenosis. There was no regurgitation. - Mitral valve: Transvalvular velocity was within the normal range. There was no evidence for stenosis. There was mild regurgitation. Valve area by pressure half-time: 2.5 cm^2. Valve area by continuity equation (using LVOT flow): 0.48 cm^2. - Right ventricle: The cavity size was normal. Wall thickness was normal. Systolic function was normal. - Tricuspid valve: There was no regurgitation. - Pericardium, extracardiac: A trivial pericardial effusion was identified.  EKG:  EKG is not ordered today.   Recent Labs: 04/05/2018: B Natriuretic Peptide 624.3 04/06/2018: BUN 10; Creatinine, Ser 0.92; Hemoglobin 12.8; Platelets 377; Potassium 3.7; Sodium 141; TSH 3.248   Recent Lipid Panel    Component Value Date/Time   CHOL 119 04/06/2018 0647   TRIG 53 04/06/2018 0647   HDL 23 (L) 04/06/2018 0647   CHOLHDL 5.2 04/06/2018 0647    VLDL 11 04/06/2018 0647   LDLCALC 85 04/06/2018 0647    Physical Exam:    VS:  BP 138/80   Pulse 77   Ht 6' (1.829 m)   Wt 198 lb 12.8 oz (90.2 kg)   SpO2 95%   BMI 26.96 kg/m     Wt Readings from Last 3 Encounters:  04/22/18 198 lb 12.8 oz (90.2 kg)  04/05/18 195 lb (88.5 kg)     Physical Exam  Constitutional: He is oriented to person, place, and time. He appears well-developed and well-nourished. No distress.  HENT:  Head: Normocephalic and atraumatic.  Neck: Normal range of motion. Neck supple. No JVD present.  Cardiovascular: Normal rate, regular rhythm, normal heart sounds and intact distal pulses. Exam reveals no gallop  and no friction rub.  No murmur heard. Pulmonary/Chest: Effort normal and breath sounds normal. No respiratory distress. He has no wheezes. He has no rales.  Abdominal: Soft. Bowel sounds are normal.  Musculoskeletal: Normal range of motion.        General: No deformity or edema.  Neurological: He is alert and oriented to person, place, and time.  Skin: Skin is warm and dry.  Psychiatric: He has a normal mood and affect. His behavior is normal. Judgment and thought content normal.  Vitals reviewed.    ASSESSMENT:    1. Shortness of breath   2. Hyperlipidemia, unspecified hyperlipidemia type    PLAN:    In order of problems listed above:  1. Shortness of breath -In setting of sinus infection. Now fully resolved. -Troponins mildly elevated at 0.28 so LHC done and showed only mild non-obstructive CAD. Treated for possible mild pericarditis with 2 weeks of ibuprofen. Ibuprofen is completed and pt is backt o normal with no shortness of breath.   2. Hyperlipidemia -LDL was 85. Continue atorvastatin 10 mg daily    Medication Adjustments/Labs and Tests Ordered: Current medicines are reviewed at length with the patient today.  Concerns regarding medicines are outlined above. Labs and tests ordered and medication changes are outlined in the patient  instructions below:  Patient Instructions  Medication Instructions:  Your physician recommends that you continue on your current medications as directed. Please refer to the Current Medication list given to you today.   Labwork: -None  Testing/Procedures: -None  Follow-Up: As needed  Any Other Special Instructions Will Be Listed Below (If Applicable).   DASH Eating Plan DASH stands for "Dietary Approaches to Stop Hypertension." The DASH eating plan is a healthy eating plan that has been shown to reduce high blood pressure (hypertension). It may also reduce your risk for type 2 diabetes, heart disease, and stroke. The DASH eating plan may also help with weight loss. What are tips for following this plan?  General guidelines  Avoid eating more than 2,300 mg (milligrams) of salt (sodium) a day. If you have hypertension, you may need to reduce your sodium intake to 1,500 mg a day.  Limit alcohol intake to no more than 1 drink a day for nonpregnant women and 2 drinks a day for men. One drink equals 12 oz of beer, 5 oz of wine, or 1 oz of hard liquor.  Work with your health care provider to maintain a healthy body weight or to lose weight. Ask what an ideal weight is for you.  Get at least 30 minutes of exercise that causes your heart to beat faster (aerobic exercise) most days of the week. Activities may include walking, swimming, or biking.  Work with your health care provider or diet and nutrition specialist (dietitian) to adjust your eating plan to your individual calorie needs. Reading food labels   Check food labels for the amount of sodium per serving. Choose foods with less than 5 percent of the Daily Value of sodium. Generally, foods with less than 300 mg of sodium per serving fit into this eating plan.  To find whole grains, look for the word "whole" as the first word in the ingredient list. Shopping  Buy products labeled as "low-sodium" or "no salt added."  Buy fresh  foods. Avoid canned foods and premade or frozen meals. Cooking  Avoid adding salt when cooking. Use salt-free seasonings or herbs instead of table salt or sea salt. Check with your health care provider  or pharmacist before using salt substitutes.  Do not fry foods. Cook foods using healthy methods such as baking, boiling, grilling, and broiling instead.  Cook with heart-healthy oils, such as olive, canola, soybean, or sunflower oil. Meal planning  Eat a balanced diet that includes: ? 5 or more servings of fruits and vegetables each day. At each meal, try to fill half of your plate with fruits and vegetables. ? Up to 6-8 servings of whole grains each day. ? Less than 6 oz of lean meat, poultry, or fish each day. A 3-oz serving of meat is about the same size as a deck of cards. One egg equals 1 oz. ? 2 servings of low-fat dairy each day. ? A serving of nuts, seeds, or beans 5 times each week. ? Heart-healthy fats. Healthy fats called Omega-3 fatty acids are found in foods such as flaxseeds and coldwater fish, like sardines, salmon, and mackerel.  Limit how much you eat of the following: ? Canned or prepackaged foods. ? Food that is high in trans fat, such as fried foods. ? Food that is high in saturated fat, such as fatty meat. ? Sweets, desserts, sugary drinks, and other foods with added sugar. ? Full-fat dairy products.  Do not salt foods before eating.  Try to eat at least 2 vegetarian meals each week.  Eat more home-cooked food and less restaurant, buffet, and fast food.  When eating at a restaurant, ask that your food be prepared with less salt or no salt, if possible. What foods are recommended? The items listed may not be a complete list. Talk with your dietitian about what dietary choices are best for you. Grains Whole-grain or whole-wheat bread. Whole-grain or whole-wheat pasta. Brown rice. Orpah Cobb. Bulgur. Whole-grain and low-sodium cereals. Pita bread. Low-fat,  low-sodium crackers. Whole-wheat flour tortillas. Vegetables Fresh or frozen vegetables (raw, steamed, roasted, or grilled). Low-sodium or reduced-sodium tomato and vegetable juice. Low-sodium or reduced-sodium tomato sauce and tomato paste. Low-sodium or reduced-sodium canned vegetables. Fruits All fresh, dried, or frozen fruit. Canned fruit in natural juice (without added sugar). Meat and other protein foods Skinless chicken or [place]. Ground chicken or [place]. Pork with fat trimmed off. Fish and seafood. Egg whites. Dried beans, peas, or lentils. Unsalted nuts, nut butters, and seeds. Unsalted canned beans. Lean cuts of beef with fat trimmed off. Low-sodium, lean deli meat. Dairy Low-fat (1%) or fat-free (skim) milk. Fat-free, low-fat, or reduced-fat cheeses. Nonfat, low-sodium ricotta or cottage cheese. Low-fat or nonfat yogurt. Low-fat, low-sodium cheese. Fats and oils Soft margarine without trans fats. Vegetable oil. Low-fat, reduced-fat, or light mayonnaise and salad dressings (reduced-sodium). Canola, safflower, olive, soybean, and sunflower oils. Avocado. Seasoning and other foods Herbs. Spices. Seasoning mixes without salt. Unsalted popcorn and pretzels. Fat-free sweets. What foods are not recommended? The items listed may not be a complete list. Talk with your dietitian about what dietary choices are best for you. Grains Baked goods made with fat, such as croissants, muffins, or some breads. Dry pasta or rice meal packs. Vegetables Creamed or fried vegetables. Vegetables in a cheese sauce. Regular canned vegetables (not low-sodium or reduced-sodium). Regular canned tomato sauce and paste (not low-sodium or reduced-sodium). Regular tomato and vegetable juice (not low-sodium or reduced-sodium). Rosita Fire. Olives. Fruits Canned fruit in a light or heavy syrup. Fried fruit. Fruit in cream or butter sauce. Meat and other protein foods Fatty cuts of meat. Ribs. Fried meat. Tomasa Blase. Sausage.  Bologna and other processed lunch meats. Salami. Fatback. Hotdogs. Bratwurst.  Salted nuts and seeds. Canned beans with added salt. Canned or smoked fish. Whole eggs or egg yolks. Chicken or [place] with skin. Dairy Whole or 2% milk, cream, and half-and-half. Whole or full-fat cream cheese. Whole-fat or sweetened yogurt. Full-fat cheese. Nondairy creamers. Whipped toppings. Processed cheese and cheese spreads. Fats and oils Butter. Stick margarine. Lard. Shortening. Ghee. Bacon fat. Tropical oils, such as coconut, palm kernel, or palm oil. Seasoning and other foods Salted popcorn and pretzels. Onion salt, garlic salt, seasoned salt, table salt, and sea salt. Worcestershire sauce. Tartar sauce. Barbecue sauce. Teriyaki sauce. Soy sauce, including reduced-sodium. Steak sauce. Canned and packaged gravies. Fish sauce. Oyster sauce. Cocktail sauce. Horseradish that you find on the shelf. Ketchup. Mustard. Meat flavorings and tenderizers. Bouillon cubes. Hot sauce and Tabasco sauce. Premade or packaged marinades. Premade or packaged taco seasonings. Relishes. Regular salad dressings. Where to find more information:  National Heart, Lung, and Blood Institute: PopSteam.iswww.nhlbi.nih.gov  American Heart Association: www.heart.org Summary  The DASH eating plan is a healthy eating plan that has been shown to reduce high blood pressure (hypertension). It may also reduce your risk for type 2 diabetes, heart disease, and stroke.  With the DASH eating plan, you should limit salt (sodium) intake to 2,300 mg a day. If you have hypertension, you may need to reduce your sodium intake to 1,500 mg a day.  When on the DASH eating plan, aim to eat more fresh fruits and vegetables, whole grains, lean proteins, low-fat dairy, and heart-healthy fats.  Work with your health care provider or diet and nutrition specialist (dietitian) to adjust your eating plan to your individual calorie needs. This information is not intended to  replace advice given to you by your health care provider. Make sure you discuss any questions you have with your health care provider. Document Released: 03/20/2011 Document Revised: 03/24/2016 Document Reviewed: 03/24/2016 Elsevier Interactive Patient Education  [employer]2019 Elsevier Inc.    If you need a refill on your cardiac medications before your next appointment, please call your pharmacy.      Signed, Berton BonJanine Anique Beckley, NP  04/22/2018 5:42 PM    [facility]

## 2018-04-22 ENCOUNTER — Ambulatory Visit: Payer: Medicare Other | Admitting: Cardiology

## 2018-04-22 ENCOUNTER — Encounter: Payer: Self-pay | Admitting: Cardiology

## 2018-04-22 VITALS — BP 138/80 | HR 77 | Ht 72.0 in | Wt 198.8 lb

## 2018-04-22 DIAGNOSIS — E785 Hyperlipidemia, unspecified: Secondary | ICD-10-CM | POA: Diagnosis not present

## 2018-04-22 DIAGNOSIS — R0602 Shortness of breath: Secondary | ICD-10-CM

## 2018-04-22 NOTE — Patient Instructions (Signed)
Medication Instructions:  Your physician recommends that you continue on your current medications as directed. Please refer to the Current Medication list given to you today.   Labwork: -None  Testing/Procedures: -None  Follow-Up: As needed  Any Other Special Instructions Will Be Listed Below (If Applicable).   DASH Eating Plan DASH stands for "Dietary Approaches to Stop Hypertension." The DASH eating plan is a healthy eating plan that has been shown to reduce high blood pressure (hypertension). It may also reduce your risk for type 2 diabetes, heart disease, and stroke. The DASH eating plan may also help with weight loss. What are tips for following this plan?  General guidelines  Avoid eating more than 2,300 mg (milligrams) of salt (sodium) a day. If you have hypertension, you may need to reduce your sodium intake to 1,500 mg a day.  Limit alcohol intake to no more than 1 drink a day for nonpregnant women and 2 drinks a day for men. One drink equals 12 oz of beer, 5 oz of wine, or 1 oz of hard liquor.  Work with your health care provider to maintain a healthy body weight or to lose weight. Ask what an ideal weight is for you.  Get at least 30 minutes of exercise that causes your heart to beat faster (aerobic exercise) most days of the week. Activities may include walking, swimming, or biking.  Work with your health care provider or diet and nutrition specialist (dietitian) to adjust your eating plan to your individual calorie needs. Reading food labels   Check food labels for the amount of sodium per serving. Choose foods with less than 5 percent of the Daily Value of sodium. Generally, foods with less than 300 mg of sodium per serving fit into this eating plan.  To find whole grains, look for the word "whole" as the first word in the ingredient list. Shopping  Buy products labeled as "low-sodium" or "no salt added."  Buy fresh foods. Avoid canned foods and premade or  frozen meals. Cooking  Avoid adding salt when cooking. Use salt-free seasonings or herbs instead of table salt or sea salt. Check with your health care provider or pharmacist before using salt substitutes.  Do not fry foods. Cook foods using healthy methods such as baking, boiling, grilling, and broiling instead.  Cook with heart-healthy oils, such as olive, canola, soybean, or sunflower oil. Meal planning  Eat a balanced diet that includes: ? 5 or more servings of fruits and vegetables each day. At each meal, try to fill half of your plate with fruits and vegetables. ? Up to 6-8 servings of whole grains each day. ? Less than 6 oz of lean meat, poultry, or fish each day. A 3-oz serving of meat is about the same size as a deck of cards. One egg equals 1 oz. ? 2 servings of low-fat dairy each day. ? A serving of nuts, seeds, or beans 5 times each week. ? Heart-healthy fats. Healthy fats called Omega-3 fatty acids are found in foods such as flaxseeds and coldwater fish, like sardines, salmon, and mackerel.  Limit how much you eat of the following: ? Canned or prepackaged foods. ? Food that is high in trans fat, such as fried foods. ? Food that is high in saturated fat, such as fatty meat. ? Sweets, desserts, sugary drinks, and other foods with added sugar. ? Full-fat dairy products.  Do not salt foods before eating.  Try to eat at least 2 vegetarian meals each week.  Eat more home-cooked food and less restaurant, buffet, and fast food.  When eating at a restaurant, ask that your food be prepared with less salt or no salt, if possible. What foods are recommended? The items listed may not be a complete list. Talk with your dietitian about what dietary choices are best for you. Grains Whole-grain or whole-wheat bread. Whole-grain or whole-wheat pasta. Brown rice. Modena Morrow. Bulgur. Whole-grain and low-sodium cereals. Pita bread. Low-fat, low-sodium crackers. Whole-wheat flour  tortillas. Vegetables Fresh or frozen vegetables (raw, steamed, roasted, or grilled). Low-sodium or reduced-sodium tomato and vegetable juice. Low-sodium or reduced-sodium tomato sauce and tomato paste. Low-sodium or reduced-sodium canned vegetables. Fruits All fresh, dried, or frozen fruit. Canned fruit in natural juice (without added sugar). Meat and other protein foods Skinless chicken or [place]. Ground chicken or [place]. Pork with fat trimmed off. Fish and seafood. Egg whites. Dried beans, peas, or lentils. Unsalted nuts, nut butters, and seeds. Unsalted canned beans. Lean cuts of beef with fat trimmed off. Low-sodium, lean deli meat. Dairy Low-fat (1%) or fat-free (skim) milk. Fat-free, low-fat, or reduced-fat cheeses. Nonfat, low-sodium ricotta or cottage cheese. Low-fat or nonfat yogurt. Low-fat, low-sodium cheese. Fats and oils Soft margarine without trans fats. Vegetable oil. Low-fat, reduced-fat, or light mayonnaise and salad dressings (reduced-sodium). Canola, safflower, olive, soybean, and sunflower oils. Avocado. Seasoning and other foods Herbs. Spices. Seasoning mixes without salt. Unsalted popcorn and pretzels. Fat-free sweets. What foods are not recommended? The items listed may not be a complete list. Talk with your dietitian about what dietary choices are best for you. Grains Baked goods made with fat, such as croissants, muffins, or some breads. Dry pasta or rice meal packs. Vegetables Creamed or fried vegetables. Vegetables in a cheese sauce. Regular canned vegetables (not low-sodium or reduced-sodium). Regular canned tomato sauce and paste (not low-sodium or reduced-sodium). Regular tomato and vegetable juice (not low-sodium or reduced-sodium). Angie Fava. Olives. Fruits Canned fruit in a light or heavy syrup. Fried fruit. Fruit in cream or butter sauce. Meat and other protein foods Fatty cuts of meat. Ribs. Fried meat. Berniece Salines. Sausage. Bologna and other processed lunch meats.  Salami. Fatback. Hotdogs. Bratwurst. Salted nuts and seeds. Canned beans with added salt. Canned or smoked fish. Whole eggs or egg yolks. Chicken or [place] with skin. Dairy Whole or 2% milk, cream, and half-and-half. Whole or full-fat cream cheese. Whole-fat or sweetened yogurt. Full-fat cheese. Nondairy creamers. Whipped toppings. Processed cheese and cheese spreads. Fats and oils Butter. Stick margarine. Lard. Shortening. Ghee. Bacon fat. Tropical oils, such as coconut, palm kernel, or palm oil. Seasoning and other foods Salted popcorn and pretzels. Onion salt, garlic salt, seasoned salt, table salt, and sea salt. Worcestershire sauce. Tartar sauce. Barbecue sauce. Teriyaki sauce. Soy sauce, including reduced-sodium. Steak sauce. Canned and packaged gravies. Fish sauce. Oyster sauce. Cocktail sauce. Horseradish that you find on the shelf. Ketchup. Mustard. Meat flavorings and tenderizers. Bouillon cubes. Hot sauce and Tabasco sauce. Premade or packaged marinades. Premade or packaged taco seasonings. Relishes. Regular salad dressings. Where to find more information:  National Heart, Lung, and [place]: https://wilson-eaton.com/  American Heart Association: www.heart.org Summary  The DASH eating plan is a healthy eating plan that has been shown to reduce high blood pressure (hypertension). It may also reduce your risk for type 2 diabetes, heart disease, and stroke.  With the DASH eating plan, you should limit salt (sodium) intake to 2,300 mg a day. If you have hypertension, you may need to reduce your sodium intake  to 1,500 mg a day.  When on the DASH eating plan, aim to eat more fresh fruits and vegetables, whole grains, lean proteins, low-fat dairy, and heart-healthy fats.  Work with your health care provider or diet and nutrition specialist (dietitian) to adjust your eating plan to your individual calorie needs. This information is not intended to replace advice given to you by your health  care provider. Make sure you discuss any questions you have with your health care provider. Document Released: 03/20/2011 Document Revised: 03/24/2016 Document Reviewed: 03/24/2016 Elsevier Interactive Patient Education  [employer].    If you need a refill on your cardiac medications before your next appointment, please call your pharmacy.

## 2018-06-02 DIAGNOSIS — H353223 Exudative age-related macular degeneration, left eye, with inactive scar: Secondary | ICD-10-CM | POA: Diagnosis not present

## 2018-06-02 DIAGNOSIS — H2513 Age-related nuclear cataract, bilateral: Secondary | ICD-10-CM | POA: Diagnosis not present

## 2018-06-28 DIAGNOSIS — E7800 Pure hypercholesterolemia, unspecified: Secondary | ICD-10-CM | POA: Diagnosis not present

## 2018-06-28 DIAGNOSIS — Z1389 Encounter for screening for other disorder: Secondary | ICD-10-CM | POA: Diagnosis not present

## 2018-06-28 DIAGNOSIS — Z0000 Encounter for general adult medical examination without abnormal findings: Secondary | ICD-10-CM | POA: Diagnosis not present

## 2018-06-28 DIAGNOSIS — N400 Enlarged prostate without lower urinary tract symptoms: Secondary | ICD-10-CM | POA: Diagnosis not present

## 2018-07-14 DIAGNOSIS — D489 Neoplasm of uncertain behavior, unspecified: Secondary | ICD-10-CM | POA: Diagnosis not present

## 2018-07-20 DIAGNOSIS — L308 Other specified dermatitis: Secondary | ICD-10-CM | POA: Diagnosis not present

## 2018-07-20 DIAGNOSIS — D485 Neoplasm of uncertain behavior of skin: Secondary | ICD-10-CM | POA: Diagnosis not present

## 2018-07-20 DIAGNOSIS — L309 Dermatitis, unspecified: Secondary | ICD-10-CM | POA: Diagnosis not present

## 2018-11-17 DIAGNOSIS — L821 Other seborrheic keratosis: Secondary | ICD-10-CM | POA: Diagnosis not present

## 2018-11-17 DIAGNOSIS — L814 Other melanin hyperpigmentation: Secondary | ICD-10-CM | POA: Diagnosis not present

## 2018-11-17 DIAGNOSIS — D225 Melanocytic nevi of trunk: Secondary | ICD-10-CM | POA: Diagnosis not present

## 2018-11-17 DIAGNOSIS — Z85828 Personal history of other malignant neoplasm of skin: Secondary | ICD-10-CM | POA: Diagnosis not present

## 2018-12-15 DIAGNOSIS — N401 Enlarged prostate with lower urinary tract symptoms: Secondary | ICD-10-CM | POA: Diagnosis not present

## 2018-12-22 DIAGNOSIS — N5201 Erectile dysfunction due to arterial insufficiency: Secondary | ICD-10-CM | POA: Diagnosis not present

## 2018-12-22 DIAGNOSIS — R3915 Urgency of urination: Secondary | ICD-10-CM | POA: Diagnosis not present

## 2018-12-22 DIAGNOSIS — N403 Nodular prostate with lower urinary tract symptoms: Secondary | ICD-10-CM | POA: Diagnosis not present

## 2018-12-22 DIAGNOSIS — N401 Enlarged prostate with lower urinary tract symptoms: Secondary | ICD-10-CM | POA: Diagnosis not present

## 2019-03-04 DIAGNOSIS — H353222 Exudative age-related macular degeneration, left eye, with inactive choroidal neovascularization: Secondary | ICD-10-CM | POA: Diagnosis not present

## 2019-03-04 DIAGNOSIS — H43811 Vitreous degeneration, right eye: Secondary | ICD-10-CM | POA: Diagnosis not present

## 2019-03-04 DIAGNOSIS — H353112 Nonexudative age-related macular degeneration, right eye, intermediate dry stage: Secondary | ICD-10-CM | POA: Diagnosis not present

## 2019-05-14 ENCOUNTER — Ambulatory Visit: Payer: Medicare Other

## 2019-05-19 ENCOUNTER — Ambulatory Visit: Payer: Medicare Other

## 2019-05-21 ENCOUNTER — Ambulatory Visit: Payer: Medicare Other | Attending: Internal Medicine

## 2019-05-21 DIAGNOSIS — Z23 Encounter for immunization: Secondary | ICD-10-CM | POA: Insufficient documentation

## 2019-05-21 NOTE — Progress Notes (Signed)
   Covid-[facility] Vaccination [facility]  Name:  ALEXIS MIZUNO    MRN: [id number] DOB: 1946-01-29  05/21/2019  Mr. Soward was observed post Covid-19 immunization for 15 minutes without incidence. He was provided with Vaccine Information Sheet and instruction to access the V-Safe system.   Mr. Ketelsen was instructed to call 911 with any severe reactions post vaccine: Marland Kitchen Difficulty breathing  . Swelling of your face and throat  . A fast heartbeat  . A bad rash all over your body  . Dizziness and weakness    Immunizations Administered    Name Date Dose VIS Date Route   Pfizer COVID-19 Vaccine 05/21/2019  5:55 PM 0.3 mL 03/25/2019 Intramuscular   Manufacturer: [employer], [employer]   Lot: [id number]   NDC: [address]-8

## 2019-06-15 ENCOUNTER — Ambulatory Visit: Payer: Medicare Other | Attending: Internal Medicine

## 2019-06-15 ENCOUNTER — Ambulatory Visit: Payer: Medicare Other

## 2019-06-15 DIAGNOSIS — Z23 Encounter for immunization: Secondary | ICD-10-CM | POA: Insufficient documentation

## 2019-06-15 NOTE — Progress Notes (Signed)
   Covid-19 Vaccination Clinic  Name:  Jaime Clements    MRN: [id number] DOB: 01-06-1946  06/15/2019  Jaime Clements was observed post Covid-19 immunization for 15 minutes without incident. He was provided with Vaccine Information Sheet and instruction to access the V-Safe system.   Jaime Clements was instructed to call 911 with any severe reactions post vaccine: Marland Kitchen Difficulty breathing  . Swelling of face and throat  . A fast heartbeat  . A bad rash all over body  . Dizziness and weakness   Immunizations Administered    Name Date Dose VIS Date Route   Pfizer COVID-19 Vaccine 06/15/2019 10:50 AM 0.3 mL 03/25/2019 Intramuscular   Manufacturer: [employer], [employer]   Lot: [id number]   NDC: [address]-7

## 2019-07-11 DIAGNOSIS — E7800 Pure hypercholesterolemia, unspecified: Secondary | ICD-10-CM | POA: Diagnosis not present

## 2019-07-11 DIAGNOSIS — Z1389 Encounter for screening for other disorder: Secondary | ICD-10-CM | POA: Diagnosis not present

## 2019-07-11 DIAGNOSIS — I10 Essential (primary) hypertension: Secondary | ICD-10-CM | POA: Diagnosis not present

## 2019-07-11 DIAGNOSIS — Z0000 Encounter for general adult medical examination without abnormal findings: Secondary | ICD-10-CM | POA: Diagnosis not present

## 2019-08-03 DIAGNOSIS — H5213 Myopia, bilateral: Secondary | ICD-10-CM | POA: Diagnosis not present

## 2019-08-03 DIAGNOSIS — H524 Presbyopia: Secondary | ICD-10-CM | POA: Diagnosis not present

## 2019-08-21 DIAGNOSIS — Z20828 Contact with and (suspected) exposure to other viral communicable diseases: Secondary | ICD-10-CM | POA: Diagnosis not present

## 2019-11-28 DIAGNOSIS — C44612 Basal cell carcinoma of skin of right upper limb, including shoulder: Secondary | ICD-10-CM | POA: Diagnosis not present

## 2019-11-28 DIAGNOSIS — L814 Other melanin hyperpigmentation: Secondary | ICD-10-CM | POA: Diagnosis not present

## 2019-11-28 DIAGNOSIS — L570 Actinic keratosis: Secondary | ICD-10-CM | POA: Diagnosis not present

## 2019-11-28 DIAGNOSIS — C44712 Basal cell carcinoma of skin of right lower limb, including hip: Secondary | ICD-10-CM | POA: Diagnosis not present

## 2019-11-28 DIAGNOSIS — D045 Carcinoma in situ of skin of trunk: Secondary | ICD-10-CM | POA: Diagnosis not present

## 2019-11-28 DIAGNOSIS — C44519 Basal cell carcinoma of skin of other part of trunk: Secondary | ICD-10-CM | POA: Diagnosis not present

## 2019-11-28 DIAGNOSIS — D1801 Hemangioma of skin and subcutaneous tissue: Secondary | ICD-10-CM | POA: Diagnosis not present

## 2019-11-28 DIAGNOSIS — D225 Melanocytic nevi of trunk: Secondary | ICD-10-CM | POA: Diagnosis not present

## 2019-12-13 DIAGNOSIS — L738 Other specified follicular disorders: Secondary | ICD-10-CM | POA: Diagnosis not present

## 2019-12-13 DIAGNOSIS — C44712 Basal cell carcinoma of skin of right lower limb, including hip: Secondary | ICD-10-CM | POA: Diagnosis not present

## 2019-12-21 DIAGNOSIS — N403 Nodular prostate with lower urinary tract symptoms: Secondary | ICD-10-CM | POA: Diagnosis not present

## 2019-12-28 DIAGNOSIS — R3915 Urgency of urination: Secondary | ICD-10-CM | POA: Diagnosis not present

## 2019-12-28 DIAGNOSIS — N403 Nodular prostate with lower urinary tract symptoms: Secondary | ICD-10-CM | POA: Diagnosis not present

## 2019-12-28 DIAGNOSIS — N5201 Erectile dysfunction due to arterial insufficiency: Secondary | ICD-10-CM | POA: Diagnosis not present

## 2019-12-28 DIAGNOSIS — N401 Enlarged prostate with lower urinary tract symptoms: Secondary | ICD-10-CM | POA: Diagnosis not present

## 2019-12-30 DIAGNOSIS — Z20828 Contact with and (suspected) exposure to other viral communicable diseases: Secondary | ICD-10-CM | POA: Diagnosis not present

## 2020-03-16 DIAGNOSIS — H353222 Exudative age-related macular degeneration, left eye, with inactive choroidal neovascularization: Secondary | ICD-10-CM | POA: Diagnosis not present

## 2020-03-16 DIAGNOSIS — H353112 Nonexudative age-related macular degeneration, right eye, intermediate dry stage: Secondary | ICD-10-CM | POA: Diagnosis not present

## 2020-03-16 DIAGNOSIS — H35423 Microcystoid degeneration of retina, bilateral: Secondary | ICD-10-CM | POA: Diagnosis not present

## 2020-03-16 DIAGNOSIS — H43811 Vitreous degeneration, right eye: Secondary | ICD-10-CM | POA: Diagnosis not present

## 2020-06-01 DIAGNOSIS — L308 Other specified dermatitis: Secondary | ICD-10-CM | POA: Diagnosis not present

## 2020-06-01 DIAGNOSIS — D485 Neoplasm of uncertain behavior of skin: Secondary | ICD-10-CM | POA: Diagnosis not present

## 2020-06-01 DIAGNOSIS — L820 Inflamed seborrheic keratosis: Secondary | ICD-10-CM | POA: Diagnosis not present

## 2020-06-01 DIAGNOSIS — L718 Other rosacea: Secondary | ICD-10-CM | POA: Diagnosis not present

## 2020-07-24 DIAGNOSIS — Z0000 Encounter for general adult medical examination without abnormal findings: Secondary | ICD-10-CM | POA: Diagnosis not present

## 2020-07-24 DIAGNOSIS — I25810 Atherosclerosis of coronary artery bypass graft(s) without angina pectoris: Secondary | ICD-10-CM | POA: Diagnosis not present

## 2020-07-24 DIAGNOSIS — E7800 Pure hypercholesterolemia, unspecified: Secondary | ICD-10-CM | POA: Diagnosis not present

## 2020-07-24 DIAGNOSIS — N400 Enlarged prostate without lower urinary tract symptoms: Secondary | ICD-10-CM | POA: Diagnosis not present

## 2020-12-19 DIAGNOSIS — N403 Nodular prostate with lower urinary tract symptoms: Secondary | ICD-10-CM | POA: Diagnosis not present

## 2020-12-26 DIAGNOSIS — N401 Enlarged prostate with lower urinary tract symptoms: Secondary | ICD-10-CM | POA: Diagnosis not present

## 2020-12-26 DIAGNOSIS — N403 Nodular prostate with lower urinary tract symptoms: Secondary | ICD-10-CM | POA: Diagnosis not present

## 2020-12-26 DIAGNOSIS — N5201 Erectile dysfunction due to arterial insufficiency: Secondary | ICD-10-CM | POA: Diagnosis not present

## 2020-12-26 DIAGNOSIS — R3915 Urgency of urination: Secondary | ICD-10-CM | POA: Diagnosis not present

## 2020-12-31 DIAGNOSIS — H3530 Unspecified macular degeneration: Secondary | ICD-10-CM | POA: Diagnosis not present

## 2021-01-01 DIAGNOSIS — D1801 Hemangioma of skin and subcutaneous tissue: Secondary | ICD-10-CM | POA: Diagnosis not present

## 2021-01-01 DIAGNOSIS — L814 Other melanin hyperpigmentation: Secondary | ICD-10-CM | POA: Diagnosis not present

## 2021-01-01 DIAGNOSIS — L570 Actinic keratosis: Secondary | ICD-10-CM | POA: Diagnosis not present

## 2021-01-01 DIAGNOSIS — L821 Other seborrheic keratosis: Secondary | ICD-10-CM | POA: Diagnosis not present

## 2021-02-18 ENCOUNTER — Other Ambulatory Visit: Payer: Self-pay | Admitting: Internal Medicine

## 2021-02-18 ENCOUNTER — Ambulatory Visit
Admission: RE | Admit: 2021-02-18 | Discharge: 2021-02-18 | Disposition: A | Discharge status: Home / Self Care | Payer: Medicare Other | Source: Ambulatory Visit | Attending: Internal Medicine | Admitting: Internal Medicine

## 2021-02-18 DIAGNOSIS — M79606 Pain in leg, unspecified: Secondary | ICD-10-CM | POA: Diagnosis not present

## 2021-02-18 DIAGNOSIS — M542 Cervicalgia: Secondary | ICD-10-CM | POA: Diagnosis not present

## 2021-03-04 DIAGNOSIS — M79606 Pain in leg, unspecified: Secondary | ICD-10-CM | POA: Diagnosis not present

## 2021-03-04 DIAGNOSIS — T466X5A Adverse effect of antihyperlipidemic and antiarteriosclerotic drugs, initial encounter: Secondary | ICD-10-CM | POA: Diagnosis not present

## 2021-03-04 DIAGNOSIS — M791 Myalgia, unspecified site: Secondary | ICD-10-CM | POA: Diagnosis not present

## 2021-03-04 DIAGNOSIS — M542 Cervicalgia: Secondary | ICD-10-CM | POA: Diagnosis not present

## 2021-03-05 ENCOUNTER — Other Ambulatory Visit: Payer: Self-pay | Admitting: Internal Medicine

## 2021-03-05 DIAGNOSIS — M542 Cervicalgia: Secondary | ICD-10-CM

## 2021-03-15 DIAGNOSIS — G4700 Insomnia, unspecified: Secondary | ICD-10-CM | POA: Diagnosis not present

## 2021-03-15 DIAGNOSIS — I10 Essential (primary) hypertension: Secondary | ICD-10-CM | POA: Diagnosis not present

## 2021-03-15 DIAGNOSIS — M5412 Radiculopathy, cervical region: Secondary | ICD-10-CM | POA: Diagnosis not present

## 2021-03-30 ENCOUNTER — Ambulatory Visit
Admission: RE | Admit: 2021-03-30 | Discharge: 2021-03-30 | Disposition: A | Discharge status: Home / Self Care | Payer: Medicare Other | Source: Ambulatory Visit | Attending: Internal Medicine | Admitting: Internal Medicine

## 2021-03-30 ENCOUNTER — Other Ambulatory Visit: Payer: Self-pay

## 2021-03-30 DIAGNOSIS — M542 Cervicalgia: Secondary | ICD-10-CM | POA: Diagnosis not present

## 2021-03-30 DIAGNOSIS — R29890 Loss of height: Secondary | ICD-10-CM | POA: Diagnosis not present

## 2021-04-18 DIAGNOSIS — M5412 Radiculopathy, cervical region: Secondary | ICD-10-CM | POA: Diagnosis not present

## 2021-05-06 DIAGNOSIS — M542 Cervicalgia: Secondary | ICD-10-CM | POA: Diagnosis not present

## 2021-05-09 DIAGNOSIS — M542 Cervicalgia: Secondary | ICD-10-CM | POA: Diagnosis not present

## 2021-05-17 DIAGNOSIS — M542 Cervicalgia: Secondary | ICD-10-CM | POA: Diagnosis not present

## 2021-05-24 DIAGNOSIS — M542 Cervicalgia: Secondary | ICD-10-CM | POA: Diagnosis not present

## 2021-05-31 DIAGNOSIS — M542 Cervicalgia: Secondary | ICD-10-CM | POA: Diagnosis not present

## 2021-06-05 DIAGNOSIS — H43813 Vitreous degeneration, bilateral: Secondary | ICD-10-CM | POA: Diagnosis not present

## 2021-06-05 DIAGNOSIS — H353112 Nonexudative age-related macular degeneration, right eye, intermediate dry stage: Secondary | ICD-10-CM | POA: Diagnosis not present

## 2021-06-05 DIAGNOSIS — D3131 Benign neoplasm of right choroid: Secondary | ICD-10-CM | POA: Diagnosis not present

## 2021-06-05 DIAGNOSIS — H353222 Exudative age-related macular degeneration, left eye, with inactive choroidal neovascularization: Secondary | ICD-10-CM | POA: Diagnosis not present

## 2021-06-07 DIAGNOSIS — M542 Cervicalgia: Secondary | ICD-10-CM | POA: Diagnosis not present

## 2021-07-05 DIAGNOSIS — M542 Cervicalgia: Secondary | ICD-10-CM | POA: Diagnosis not present

## 2021-08-07 DIAGNOSIS — E7800 Pure hypercholesterolemia, unspecified: Secondary | ICD-10-CM | POA: Diagnosis not present

## 2021-08-07 DIAGNOSIS — Z0000 Encounter for general adult medical examination without abnormal findings: Secondary | ICD-10-CM | POA: Diagnosis not present

## 2021-08-07 DIAGNOSIS — Z5181 Encounter for therapeutic drug level monitoring: Secondary | ICD-10-CM | POA: Diagnosis not present

## 2021-08-07 DIAGNOSIS — I10 Essential (primary) hypertension: Secondary | ICD-10-CM | POA: Diagnosis not present

## 2021-08-07 DIAGNOSIS — N400 Enlarged prostate without lower urinary tract symptoms: Secondary | ICD-10-CM | POA: Diagnosis not present

## 2021-08-09 DIAGNOSIS — H43813 Vitreous degeneration, bilateral: Secondary | ICD-10-CM | POA: Diagnosis not present

## 2021-08-09 DIAGNOSIS — H353222 Exudative age-related macular degeneration, left eye, with inactive choroidal neovascularization: Secondary | ICD-10-CM | POA: Diagnosis not present

## 2021-08-09 DIAGNOSIS — H353112 Nonexudative age-related macular degeneration, right eye, intermediate dry stage: Secondary | ICD-10-CM | POA: Diagnosis not present

## 2021-08-09 DIAGNOSIS — D3131 Benign neoplasm of right choroid: Secondary | ICD-10-CM | POA: Diagnosis not present

## 2021-10-18 DIAGNOSIS — M542 Cervicalgia: Secondary | ICD-10-CM | POA: Diagnosis not present

## 2021-10-24 DIAGNOSIS — M542 Cervicalgia: Secondary | ICD-10-CM | POA: Diagnosis not present

## 2021-11-01 DIAGNOSIS — M542 Cervicalgia: Secondary | ICD-10-CM | POA: Diagnosis not present

## 2021-11-08 DIAGNOSIS — M542 Cervicalgia: Secondary | ICD-10-CM | POA: Diagnosis not present

## 2021-11-19 DIAGNOSIS — M542 Cervicalgia: Secondary | ICD-10-CM | POA: Diagnosis not present

## 2021-12-05 DIAGNOSIS — M542 Cervicalgia: Secondary | ICD-10-CM | POA: Diagnosis not present

## 2022-01-17 DIAGNOSIS — N403 Nodular prostate with lower urinary tract symptoms: Secondary | ICD-10-CM | POA: Diagnosis not present

## 2022-01-20 DIAGNOSIS — M542 Cervicalgia: Secondary | ICD-10-CM | POA: Diagnosis not present

## 2022-01-22 DIAGNOSIS — N5201 Erectile dysfunction due to arterial insufficiency: Secondary | ICD-10-CM | POA: Diagnosis not present

## 2022-01-22 DIAGNOSIS — N403 Nodular prostate with lower urinary tract symptoms: Secondary | ICD-10-CM | POA: Diagnosis not present

## 2022-01-22 DIAGNOSIS — R3912 Poor urinary stream: Secondary | ICD-10-CM | POA: Diagnosis not present

## 2022-01-22 DIAGNOSIS — N401 Enlarged prostate with lower urinary tract symptoms: Secondary | ICD-10-CM | POA: Diagnosis not present

## 2022-01-25 DIAGNOSIS — Z23 Encounter for immunization: Secondary | ICD-10-CM | POA: Diagnosis not present

## 2022-03-26 DIAGNOSIS — D1801 Hemangioma of skin and subcutaneous tissue: Secondary | ICD-10-CM | POA: Diagnosis not present

## 2022-03-26 DIAGNOSIS — L820 Inflamed seborrheic keratosis: Secondary | ICD-10-CM | POA: Diagnosis not present

## 2022-03-26 DIAGNOSIS — L570 Actinic keratosis: Secondary | ICD-10-CM | POA: Diagnosis not present

## 2022-03-26 DIAGNOSIS — D0439 Carcinoma in situ of skin of other parts of face: Secondary | ICD-10-CM | POA: Diagnosis not present

## 2022-03-26 DIAGNOSIS — L814 Other melanin hyperpigmentation: Secondary | ICD-10-CM | POA: Diagnosis not present

## 2022-03-26 DIAGNOSIS — L821 Other seborrheic keratosis: Secondary | ICD-10-CM | POA: Diagnosis not present

## 2022-04-15 DIAGNOSIS — K080 Exfoliation of teeth due to systemic causes: Secondary | ICD-10-CM | POA: Diagnosis not present

## 2022-05-06 DIAGNOSIS — H43813 Vitreous degeneration, bilateral: Secondary | ICD-10-CM | POA: Diagnosis not present

## 2022-05-06 DIAGNOSIS — D3131 Benign neoplasm of right choroid: Secondary | ICD-10-CM | POA: Diagnosis not present

## 2022-05-06 DIAGNOSIS — H353222 Exudative age-related macular degeneration, left eye, with inactive choroidal neovascularization: Secondary | ICD-10-CM | POA: Diagnosis not present

## 2022-05-06 DIAGNOSIS — H353112 Nonexudative age-related macular degeneration, right eye, intermediate dry stage: Secondary | ICD-10-CM | POA: Diagnosis not present

## 2022-05-29 DIAGNOSIS — K080 Exfoliation of teeth due to systemic causes: Secondary | ICD-10-CM | POA: Diagnosis not present

## 2022-09-05 IMAGING — MR MR CERVICAL SPINE W/O CM
5 series · 28 of 48 positions shown · non-contrast
Comparison: No prior MRI

CLINICAL DATA: Neck pain and left arm weakness

EXAM:
MRI CERVICAL SPINE WITHOUT CONTRAST
TECHNIQUE: Multiplanar, multisequence MR imaging of the cervical spine was
performed. No intravenous contrast was administered.

[Series 5: T2 · sagittal · 3.0mm · 0.55mm/px · 6 of 17 slices shown (1 of 2)]
[im 1/17]
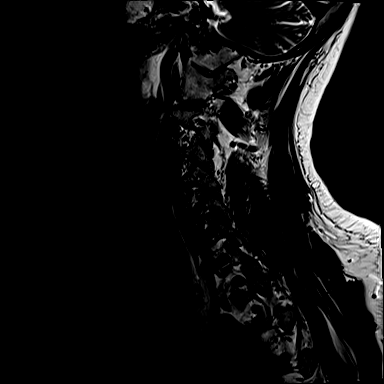
[im 4/17]
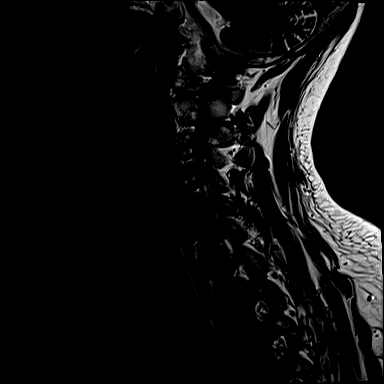
[im 7/17]
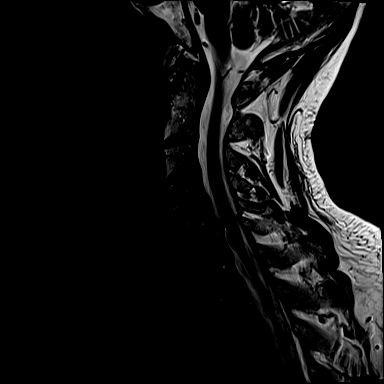
[im 10/17]
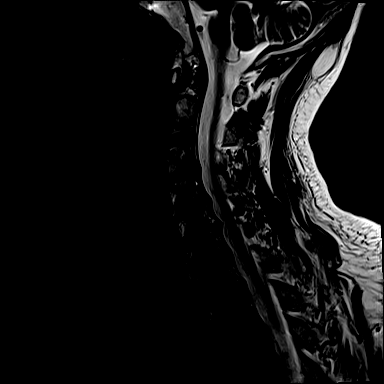
[im 13/17]
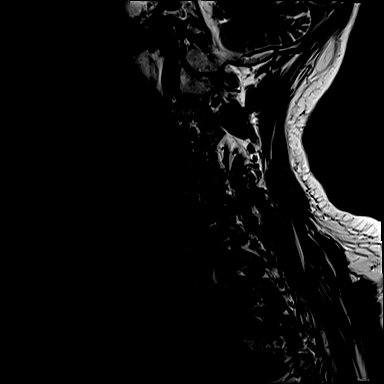
[im 17/17]
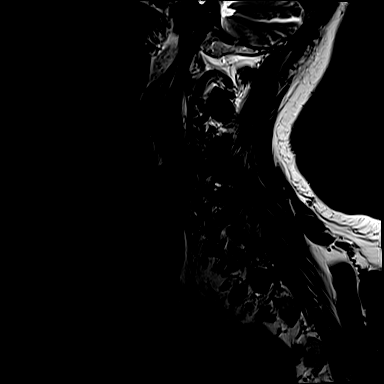

[Series 6: T1 · sagittal · 3.0mm · 0.66mm/px · 6 of 17 slices shown]
[im 1/17]
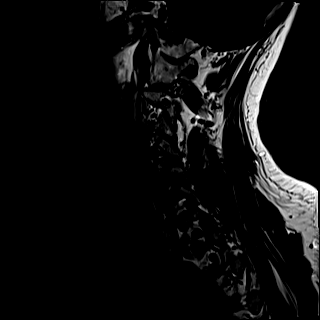
[im 4/17]
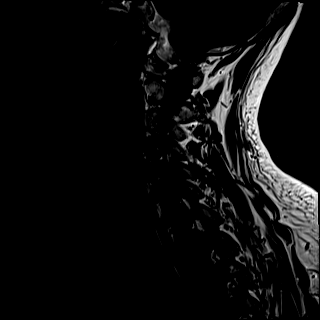
[im 7/17]
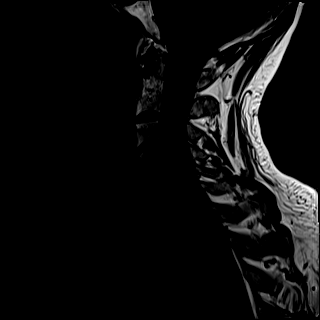
[im 10/17]
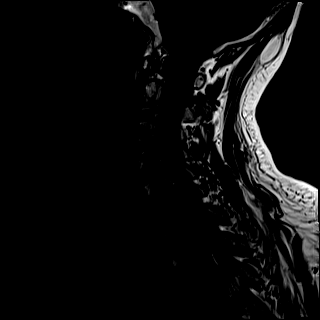
[im 13/17]
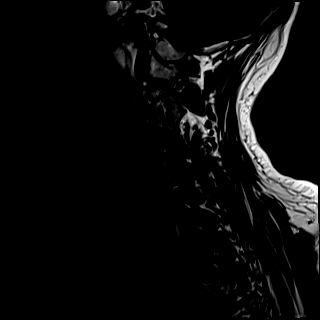
[im 17/17]
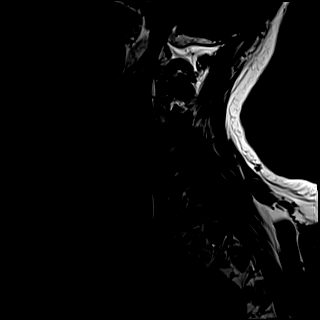

[Series 7: STIR · sagittal · 3.0mm · 0.33mm/px · 6 of 17 slices shown]
[im 1/17]
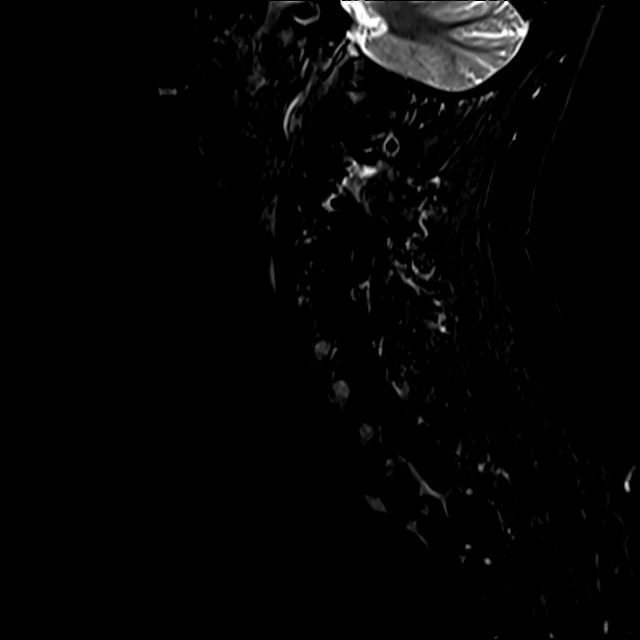
[im 4/17]
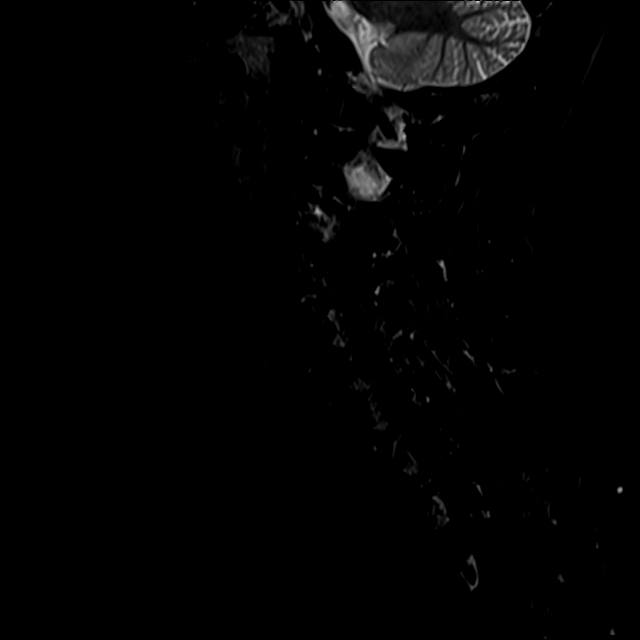
[im 7/17]
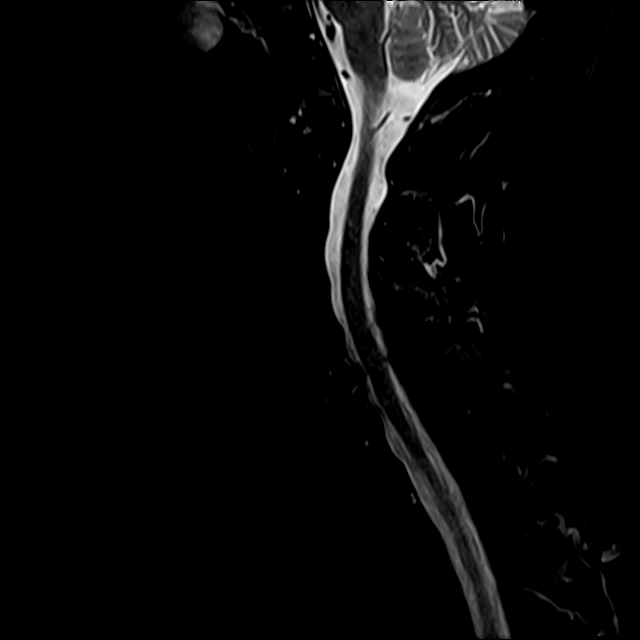
[im 10/17]
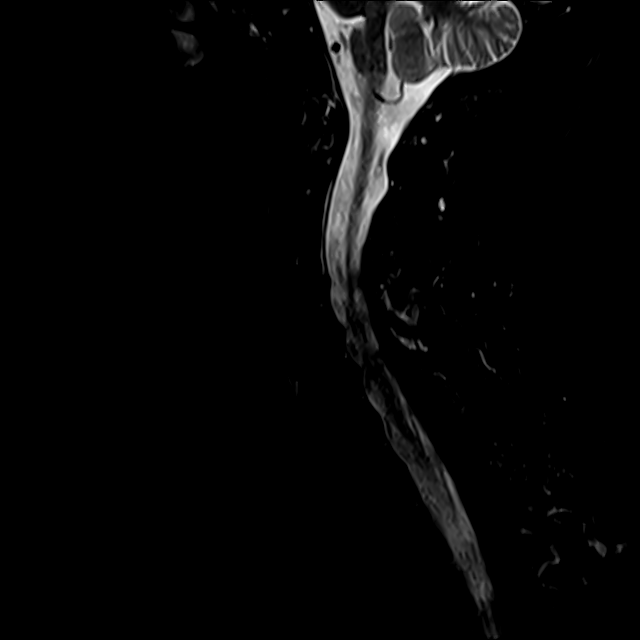
[im 13/17]
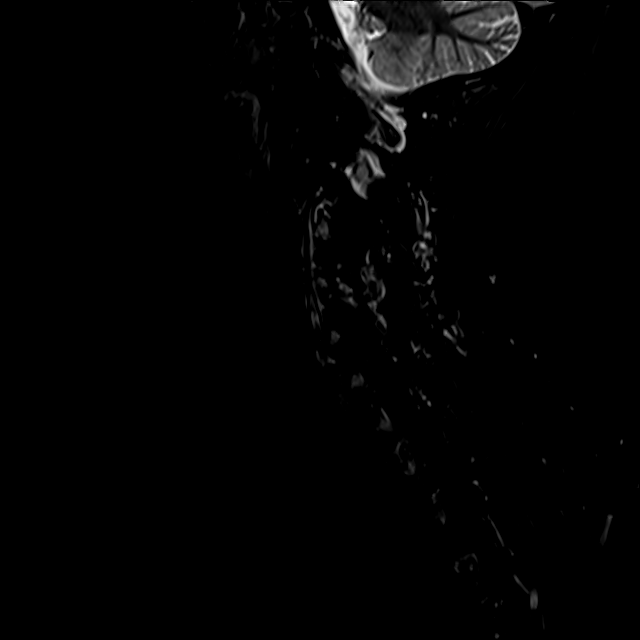
[im 17/17]
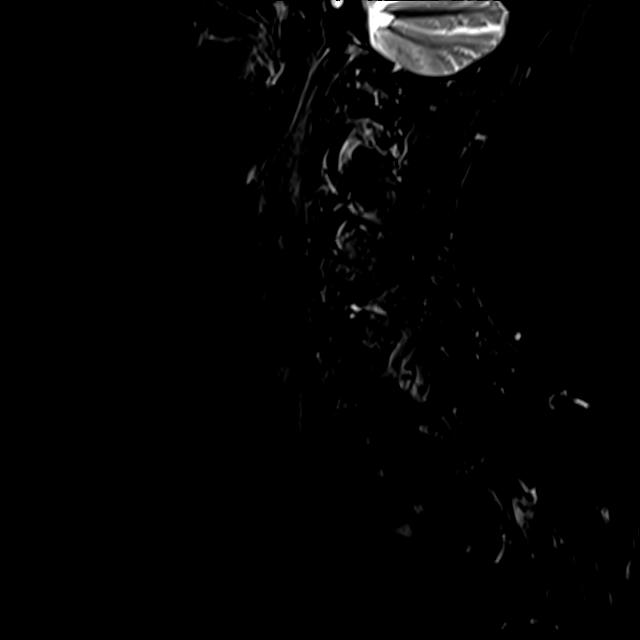

[Series 8: T2 · axial · 3.0mm · 0.50mm/px · z∈[-98,+46]mm · 9 of 45 slices shown (2 of 2)]
[im 1/45]
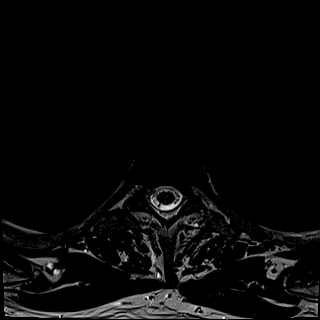
[im 7/45]
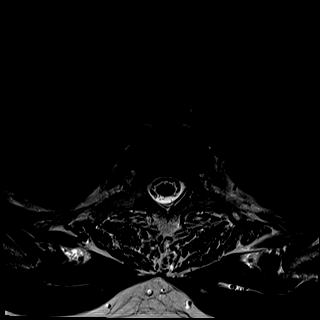
[im 13/45]
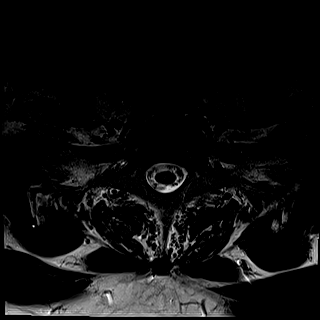
[im 19/45]
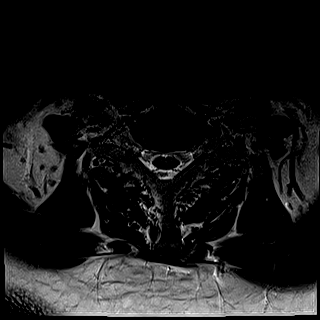
[im 23/45]
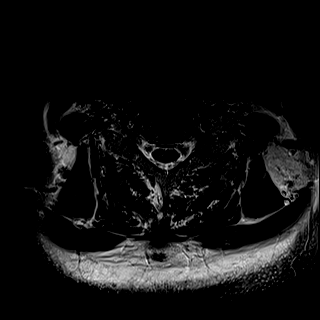
[im 26/45]
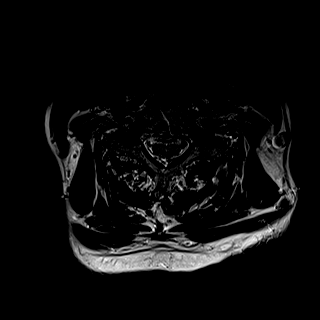
[im 32/45]
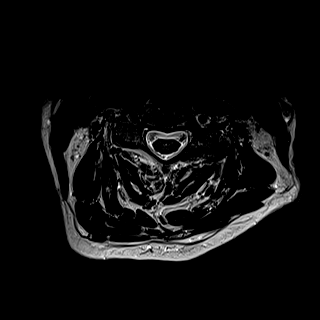
[im 38/45]
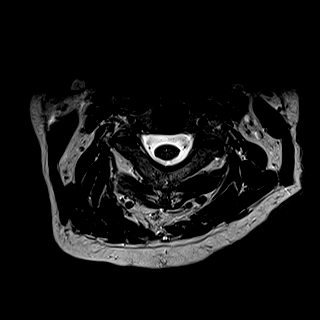
[im 45/45]
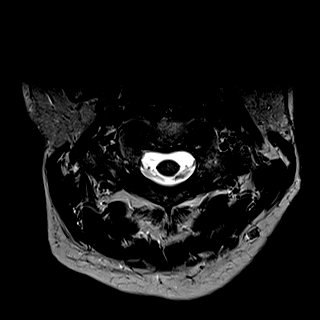

[Series 9: GRE · axial · 3.0mm · 0.42mm/px · 1 of 45 slices shown]
[im 1/45]
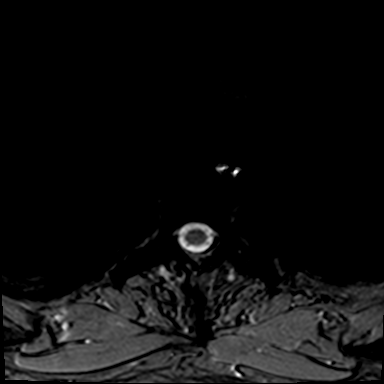

[28 of 48 positions shown; findings below may reference images not displayed]

FINDINGS: Alignment: Trace anterolisthesis C4 on C5.  Otherwise physiologic.

Vertebrae: No acute fracture or suspicious osseous lesion.

Cord: Normal signal and morphology.

Posterior Fossa, vertebral arteries, paraspinal tissues: Negative.

Disc levels:

C2-C3: No significant disc bulge. Mild facet arthropathy. No spinal
canal stenosis or neural foraminal narrowing.

C3-C4: Minimal disc bulge. Facet arthropathy. No spinal canal
stenosis. Mild left and moderate right neural foraminal narrowing.

C4-C5: Trace anterolisthesis with disc unroofing and mild disc
bulge. Uncovertebral and facet arthropathy. No spinal canal
stenosis. Mild bilateral neural foraminal narrowing.

C5-C6: Disc height loss and mild disc bulge with superimposed
central protrusion. Uncovertebral and facet arthropathy. No spinal
canal stenosis. Mild right neural foraminal narrowing.

C6-C7: Mild disc bulge. Uncovertebral and facet arthropathy. No
spinal canal stenosis. Mild left neural foraminal narrowing.

C7-T1: Mild disc bulge. Facet and uncovertebral hypertrophy. No
spinal canal stenosis or neural foraminal narrowing.
IMPRESSION: 1. No spinal canal stenosis.
2. Multilevel facet and uncovertebral hypertrophy, which causes
moderate neural foraminal narrowing on the right at C3-C4 and mild
neural foraminal narrowing on the left at C3-C4 and C6-C7, on the
right at C5-C6, and bilaterally at C4-C5.

## 2022-09-15 DIAGNOSIS — Z0000 Encounter for general adult medical examination without abnormal findings: Secondary | ICD-10-CM | POA: Diagnosis not present

## 2022-09-15 DIAGNOSIS — E7800 Pure hypercholesterolemia, unspecified: Secondary | ICD-10-CM | POA: Diagnosis not present

## 2022-09-15 DIAGNOSIS — I10 Essential (primary) hypertension: Secondary | ICD-10-CM | POA: Diagnosis not present

## 2022-09-15 DIAGNOSIS — Z5181 Encounter for therapeutic drug level monitoring: Secondary | ICD-10-CM | POA: Diagnosis not present

## 2022-09-15 DIAGNOSIS — G4700 Insomnia, unspecified: Secondary | ICD-10-CM | POA: Diagnosis not present

## 2022-12-22 DIAGNOSIS — K080 Exfoliation of teeth due to systemic causes: Secondary | ICD-10-CM | POA: Diagnosis not present

## 2023-01-23 DIAGNOSIS — N403 Nodular prostate with lower urinary tract symptoms: Secondary | ICD-10-CM | POA: Diagnosis not present

## 2023-01-30 DIAGNOSIS — N5201 Erectile dysfunction due to arterial insufficiency: Secondary | ICD-10-CM | POA: Diagnosis not present

## 2023-01-30 DIAGNOSIS — N403 Nodular prostate with lower urinary tract symptoms: Secondary | ICD-10-CM | POA: Diagnosis not present

## 2023-01-30 DIAGNOSIS — N401 Enlarged prostate with lower urinary tract symptoms: Secondary | ICD-10-CM | POA: Diagnosis not present

## 2023-01-30 DIAGNOSIS — R3912 Poor urinary stream: Secondary | ICD-10-CM | POA: Diagnosis not present

## 2023-03-27 DIAGNOSIS — L821 Other seborrheic keratosis: Secondary | ICD-10-CM | POA: Diagnosis not present

## 2023-03-27 DIAGNOSIS — C44519 Basal cell carcinoma of skin of other part of trunk: Secondary | ICD-10-CM | POA: Diagnosis not present

## 2023-03-27 DIAGNOSIS — D2261 Melanocytic nevi of right upper limb, including shoulder: Secondary | ICD-10-CM | POA: Diagnosis not present

## 2023-03-27 DIAGNOSIS — L814 Other melanin hyperpigmentation: Secondary | ICD-10-CM | POA: Diagnosis not present

## 2023-03-27 DIAGNOSIS — L720 Epidermal cyst: Secondary | ICD-10-CM | POA: Diagnosis not present

## 2023-03-27 DIAGNOSIS — L570 Actinic keratosis: Secondary | ICD-10-CM | POA: Diagnosis not present

## 2023-03-30 DIAGNOSIS — M9905 Segmental and somatic dysfunction of pelvic region: Secondary | ICD-10-CM | POA: Diagnosis not present

## 2023-03-30 DIAGNOSIS — M9904 Segmental and somatic dysfunction of sacral region: Secondary | ICD-10-CM | POA: Diagnosis not present

## 2023-03-30 DIAGNOSIS — M9903 Segmental and somatic dysfunction of lumbar region: Secondary | ICD-10-CM | POA: Diagnosis not present

## 2023-03-30 DIAGNOSIS — M5136 Other intervertebral disc degeneration, lumbar region with discogenic back pain only: Secondary | ICD-10-CM | POA: Diagnosis not present

## 2023-04-02 DIAGNOSIS — M9905 Segmental and somatic dysfunction of pelvic region: Secondary | ICD-10-CM | POA: Diagnosis not present

## 2023-04-02 DIAGNOSIS — M9903 Segmental and somatic dysfunction of lumbar region: Secondary | ICD-10-CM | POA: Diagnosis not present

## 2023-04-02 DIAGNOSIS — M5136 Other intervertebral disc degeneration, lumbar region with discogenic back pain only: Secondary | ICD-10-CM | POA: Diagnosis not present

## 2023-04-02 DIAGNOSIS — M9904 Segmental and somatic dysfunction of sacral region: Secondary | ICD-10-CM | POA: Diagnosis not present

## 2023-04-06 DIAGNOSIS — C44519 Basal cell carcinoma of skin of other part of trunk: Secondary | ICD-10-CM | POA: Diagnosis not present

## 2023-06-22 DIAGNOSIS — J301 Allergic rhinitis due to pollen: Secondary | ICD-10-CM | POA: Diagnosis not present

## 2023-06-22 DIAGNOSIS — L299 Pruritus, unspecified: Secondary | ICD-10-CM | POA: Diagnosis not present

## 2023-06-22 DIAGNOSIS — J3081 Allergic rhinitis due to animal (cat) (dog) hair and dander: Secondary | ICD-10-CM | POA: Diagnosis not present

## 2023-09-12 DIAGNOSIS — N400 Enlarged prostate without lower urinary tract symptoms: Secondary | ICD-10-CM | POA: Diagnosis not present

## 2023-09-12 DIAGNOSIS — I10 Essential (primary) hypertension: Secondary | ICD-10-CM | POA: Diagnosis not present

## 2023-09-12 DIAGNOSIS — E7800 Pure hypercholesterolemia, unspecified: Secondary | ICD-10-CM | POA: Diagnosis not present

## 2023-11-12 DIAGNOSIS — Z5181 Encounter for therapeutic drug level monitoring: Secondary | ICD-10-CM | POA: Diagnosis not present

## 2023-11-12 DIAGNOSIS — G4700 Insomnia, unspecified: Secondary | ICD-10-CM | POA: Diagnosis not present

## 2023-11-12 DIAGNOSIS — I10 Essential (primary) hypertension: Secondary | ICD-10-CM | POA: Diagnosis not present

## 2023-11-12 DIAGNOSIS — E7800 Pure hypercholesterolemia, unspecified: Secondary | ICD-10-CM | POA: Diagnosis not present

## 2023-11-12 DIAGNOSIS — Z1331 Encounter for screening for depression: Secondary | ICD-10-CM | POA: Diagnosis not present

## 2023-11-12 DIAGNOSIS — Z0000 Encounter for general adult medical examination without abnormal findings: Secondary | ICD-10-CM | POA: Diagnosis not present

## 2023-12-09 DIAGNOSIS — R17 Unspecified jaundice: Secondary | ICD-10-CM | POA: Diagnosis not present

## 2024-01-25 DIAGNOSIS — R17 Unspecified jaundice: Secondary | ICD-10-CM | POA: Diagnosis not present

## 2024-01-26 DIAGNOSIS — K080 Exfoliation of teeth due to systemic causes: Secondary | ICD-10-CM | POA: Diagnosis not present

## 2024-01-27 DIAGNOSIS — N403 Nodular prostate with lower urinary tract symptoms: Secondary | ICD-10-CM | POA: Diagnosis not present

## 2024-02-03 DIAGNOSIS — N5201 Erectile dysfunction due to arterial insufficiency: Secondary | ICD-10-CM | POA: Diagnosis not present

## 2024-02-03 DIAGNOSIS — R3912 Poor urinary stream: Secondary | ICD-10-CM | POA: Diagnosis not present

## 2024-02-03 DIAGNOSIS — N529 Male erectile dysfunction, unspecified: Secondary | ICD-10-CM | POA: Diagnosis not present

## 2024-02-03 DIAGNOSIS — N401 Enlarged prostate with lower urinary tract symptoms: Secondary | ICD-10-CM | POA: Diagnosis not present

## 2024-02-05 DIAGNOSIS — K080 Exfoliation of teeth due to systemic causes: Secondary | ICD-10-CM | POA: Diagnosis not present

## 2024-02-10 DIAGNOSIS — Z1211 Encounter for screening for malignant neoplasm of colon: Secondary | ICD-10-CM | POA: Diagnosis not present

## 2024-02-18 DIAGNOSIS — H43393 Other vitreous opacities, bilateral: Secondary | ICD-10-CM | POA: Diagnosis not present

## 2024-03-17 DIAGNOSIS — L814 Other melanin hyperpigmentation: Secondary | ICD-10-CM | POA: Diagnosis not present

## 2024-03-17 DIAGNOSIS — Z85828 Personal history of other malignant neoplasm of skin: Secondary | ICD-10-CM | POA: Diagnosis not present

## 2024-03-17 DIAGNOSIS — D1801 Hemangioma of skin and subcutaneous tissue: Secondary | ICD-10-CM | POA: Diagnosis not present

## 2024-03-17 DIAGNOSIS — L821 Other seborrheic keratosis: Secondary | ICD-10-CM | POA: Diagnosis not present

## 2024-03-17 DIAGNOSIS — L570 Actinic keratosis: Secondary | ICD-10-CM | POA: Diagnosis not present

## 2024-03-25 DIAGNOSIS — D125 Benign neoplasm of sigmoid colon: Secondary | ICD-10-CM | POA: Diagnosis not present

## 2024-03-25 DIAGNOSIS — D128 Benign neoplasm of rectum: Secondary | ICD-10-CM | POA: Diagnosis not present

## 2024-03-25 DIAGNOSIS — Z1211 Encounter for screening for malignant neoplasm of colon: Secondary | ICD-10-CM | POA: Diagnosis not present

## 2024-03-25 DIAGNOSIS — K648 Other hemorrhoids: Secondary | ICD-10-CM | POA: Diagnosis not present

## 2024-03-25 DIAGNOSIS — K5730 Diverticulosis of large intestine without perforation or abscess without bleeding: Secondary | ICD-10-CM | POA: Diagnosis not present

## 2024-03-30 DIAGNOSIS — D128 Benign neoplasm of rectum: Secondary | ICD-10-CM | POA: Diagnosis not present

## 2024-03-30 DIAGNOSIS — D125 Benign neoplasm of sigmoid colon: Secondary | ICD-10-CM | POA: Diagnosis not present
